# Patient Record
Sex: Male | Born: 1950 | Race: White | Hispanic: No | Marital: Married | Smoking: Former smoker
Health system: Southern US, Community
[De-identification: ages and names within clinical notes are randomized; demographics above are authoritative.]

## PROBLEM LIST (undated history)

## (undated) DIAGNOSIS — F0390 Unspecified dementia without behavioral disturbance: Secondary | ICD-10-CM

## (undated) DIAGNOSIS — E079 Disorder of thyroid, unspecified: Secondary | ICD-10-CM

## (undated) HISTORY — PX: HEMORRHOID SURGERY: SHX153

---

## 2018-02-27 ENCOUNTER — Ambulatory Visit
Admission: EM | Admit: 2018-02-27 | Discharge: 2018-02-27 | Disposition: A | Discharge status: Home / Self Care | Payer: Self-pay | Attending: Family Medicine | Admitting: Family Medicine

## 2018-02-27 ENCOUNTER — Other Ambulatory Visit: Payer: Self-pay

## 2018-02-27 ENCOUNTER — Encounter: Payer: Self-pay | Admitting: Emergency Medicine

## 2018-02-27 DIAGNOSIS — J069 Acute upper respiratory infection, unspecified: Secondary | ICD-10-CM

## 2018-02-27 DIAGNOSIS — B354 Tinea corporis: Secondary | ICD-10-CM

## 2018-02-27 DIAGNOSIS — R030 Elevated blood-pressure reading, without diagnosis of hypertension: Secondary | ICD-10-CM

## 2018-02-27 DIAGNOSIS — B9789 Other viral agents as the cause of diseases classified elsewhere: Secondary | ICD-10-CM | POA: Insufficient documentation

## 2018-02-27 HISTORY — DX: Unspecified dementia, unspecified severity, without behavioral disturbance, psychotic disturbance, mood disturbance, and anxiety: F03.90

## 2018-02-27 HISTORY — DX: Disorder of thyroid, unspecified: E07.9

## 2018-02-27 MED ORDER — DOXYCYCLINE HYCLATE 100 MG PO CAPS: 100.0000 mg | ORAL_CAPSULE | Freq: Two times a day (BID) | ORAL | 0 refills | Status: DC

## 2018-02-27 MED ORDER — CLOTRIMAZOLE-BETAMETHASONE 1-0.05 % EX CREA: TOPICAL_CREAM | CUTANEOUS | 0 refills | Status: DC

## 2018-02-27 NOTE — ED Triage Notes (Signed)
Patient in today c/o cough x 1 week. Patient denies fever. Patient has tried OTC Mucinex.

## 2018-02-27 NOTE — ED Provider Notes (Signed)
[facility] ____________________________________________  Time seen: Approximately 10:39 AM  I have reviewed the triage vital signs and the nursing notes.   HISTORY  Chief Complaint Cough  Historian: patient, wife and caregiver.  HPI Parker Chambers is a 67 y.o. male with history of dementia, presenting with wife and caregiver at bedside for evaluation of 1 week of nasal congestion, cough and postnasal drainage.  Caregiver states that his cough has started to sound deeper which is why they brought him in today.  Denies accompanying fevers.  Patient denies pain at this time.  No accompanying chest pain or shortness of breath.  Some intermittent sore throat as well as intermittent sinus pressure.  Unresolved with over-the-counter Mucinex.  Has continued to eat and drink well.  Denies known sick contacts.  Denies other aggravating or alleviating factors.  States cough is often dry but occasionally productive.  Caregiver states she believes she has occasionally heard some wheezing with cough.  Denies history of lung issues, pneumonia, COPD.  Denies renal insufficiency.   Also states that he has had a rash to left thigh for several weeks, states they were putting some over-the-counter cream on it and it got better but never resolved.  States also noticed 1 to right thigh.  Denies any changes in contacts.  Denies history of same.  Denies any pain.  States occasionally itchy not currently.  No insect bite.  Reports otherwise doing well denies other complaints.   Past Medical History:  Diagnosis Date  . Dementia (HCC)   . Thyroid disease     There are no active problems to display for this patient.   Past Surgical History:  Procedure Laterality Date  . HEMORRHOID SURGERY       No current facility-administered medications for this encounter.   Current Outpatient Medications:  .  donepezil (ARICEPT) 10 MG tablet, Take 10 mg by mouth at bedtime., Disp: , Rfl:  .   levothyroxine (SYNTHROID, LEVOTHROID) 100 MCG tablet, Take 100 mcg by mouth daily before breakfast., Disp: , Rfl:  .  memantine (NAMENDA) 10 MG tablet, Take 10 mg by mouth 2 (two) times daily., Disp: , Rfl:  .  mirtazapine (REMERON) 30 MG tablet, Take 30 mg by mouth at bedtime., Disp: , Rfl:  .  traZODone (DESYREL) 50 MG tablet, Take 50 mg by mouth 2 (two) times daily., Disp: , Rfl:  .  UNABLE TO FIND, Paracetamol 500 mg 2 po qid for pain (similar to Tylenol), Disp: , Rfl:  .  clotrimazole-betamethasone (LOTRISONE) cream, Apply to affected area 2 times daily for 2 weeks, Disp: 15 g, Rfl: 0 .  doxycycline (VIBRAMYCIN) 100 MG capsule, Take 1 capsule (100 mg total) by mouth 2 (two) times daily., Disp: 20 capsule, Rfl: 0  Allergies Patient has no known allergies.  Family History  Problem Relation Age of Onset  . Ovarian cancer Mother   . Pancreatic cancer Father     Social History Social History   Tobacco Use  . Smoking status: Former Smoker    Last attempt to quit: 02/27/1993    Years since quitting: 25.0  . Smokeless tobacco: Never Used  Substance Use Topics  . Alcohol use: Never    Frequency: Never  . Drug use: Never    Review of Systems Constitutional: No fever ENT:As above.  Cardiovascular: Denies chest pain. Respiratory: Denies shortness of breath. Gastrointestinal: No abdominal pain.  Musculoskeletal: Negative for back pain. Skin: Positive for rash.   ____________________________________________   PHYSICAL EXAM:  VITAL SIGNS: ED Triage Vitals  Enc Vitals Group     BP 02/27/18 1012 (!) 171/86     Pulse Rate 02/27/18 1012 100     Resp 02/27/18 1012 18     Temp 02/27/18 1012 98.2 F (36.8 C)     Temp Source 02/27/18 1012 Oral     SpO2 02/27/18 1012 96 %     Weight 02/27/18 1013 278 lb (126.1 kg)     Height 02/27/18 1013 6\' 1"  (1.854 m)     Head Circumference --      Peak Flow --      Pain Score 02/27/18 1011 6     Pain Loc --      Pain Edu? --      Excl.  in GC? --    Vitals:   02/27/18 1012 02/27/18 1013 02/27/18 1047  BP: (!) 171/86  (!) 150/90  Pulse: 100    Resp: 18    Temp: 98.2 F (36.8 C)    TempSrc: Oral    SpO2: 96%    Weight:  278 lb (126.1 kg)   Height:  6\' 1"  (1.854 m)      Constitutional: Alert.Well appearing and in no acute distress. Eyes: Conjunctivae are normal. Head: Atraumatic.No tenderness to palpation bilateral frontal and maxillary sinuses. No swelling. No erythema.   Ears: no erythema, normal TMs bilaterally.   Nose: nasal congestion with bilateral nasal turbinate erythema and edema.   Mouth/Throat: Mucous membranes are moist.  Oropharynx non-erythematous.No tonsillar swelling or exudate.  Neck: No stridor.  No cervical spine tenderness to palpation. Hematological/Lymphatic/Immunilogical: No cervical lymphadenopathy. Cardiovascular: Normal rate, regular rhythm. Grossly normal heart sounds.  Good peripheral circulation. Respiratory: Normal respiratory effort.  No retractions. No wheezes. Scattered rhonchi.  No focal area of consolidation.  Speaks in complete sentences.  Good air movement.  Gastrointestinal: Soft and nontender.  Musculoskeletal: Steady gait. Neurologic:  Normal speech and language. No gait instability. Skin:  Skin is warm, dry.  Except: Left thigh 2 areas, right thigh one area approximate 1 to 2 cm in size of oval erythematous margin with lighter center, scaly, nontender, no surrounding erythema, no drainage, no induration or fluctuance.   ___________________________________________   LABS (all labs ordered are listed, but only abnormal results are displayed)  Labs Reviewed - No data to display  PROCEDURES Procedures   INITIAL IMPRESSION / ASSESSMENT AND PLAN / ED COURSE  Pertinent labs & imaging results that were available during my care of the patient were reviewed by me and considered in my medical decision making (see chart for details).  Well-appearing patient.  No acute  distress.  Wife and caregiver bedside.  Suspect recent viral illness, concern for secondary infection.  Will start patient on oral doxycycline.  Discussed strict follow-up and return parameters including for no improvement.  Will defer chest x-ray at this time, patient and family agree.  Rash appearance consistent with tinea corporis, will treat with clotrimazole betamethasone.  Encourage supportive care.Discussed indication, risks and benefits of medications with patient and family.  Discussed follow up with Primary care physician this week. Discussed follow up and return parameters including no resolution or any worsening concerns. Patient and family verbalized understanding and agreed to plan.   ____________________________________________   FINAL CLINICAL IMPRESSION(S) / ED DIAGNOSES  Final diagnoses:  Viral URI with cough  Tinea corporis     ED Discharge Orders         Ordered    doxycycline (VIBRAMYCIN) 100 MG  capsule  2 times daily     02/27/18 1039    clotrimazole-betamethasone (LOTRISONE) cream     02/27/18 1039           Note: This dictation was prepared with Dragon dictation along with smaller phrase technology. Any transcriptional errors that result from this process are unintentional.        Renford Dills, NP 02/27/18 1108

## 2018-02-27 NOTE — Discharge Instructions (Addendum)
Take medication as prescribed. Rest. Drink plenty of fluids.  Continue to monitor blood pressure and keep a journal.  Over-the-counter plain Mucinex.  Follow up with your primary care physician this week as needed. Return to Urgent care for new or worsening concerns.

## 2019-08-04 ENCOUNTER — Inpatient Hospital Stay
Admission: EM | Admit: 2019-08-04 | Discharge: 2019-08-09 | DRG: 871 | Disposition: A | Discharge status: Skilled Nursing Facility | Payer: Medicare Other | Source: Skilled Nursing Facility | Attending: Internal Medicine | Admitting: Internal Medicine

## 2019-08-04 ENCOUNTER — Emergency Department: Payer: Medicare Other

## 2019-08-04 ENCOUNTER — Other Ambulatory Visit: Payer: Self-pay

## 2019-08-04 DIAGNOSIS — L03314 Cellulitis of groin: Secondary | ICD-10-CM | POA: Diagnosis present

## 2019-08-04 DIAGNOSIS — G92 Toxic encephalopathy: Secondary | ICD-10-CM | POA: Diagnosis present

## 2019-08-04 DIAGNOSIS — Z8673 Personal history of transient ischemic attack (TIA), and cerebral infarction without residual deficits: Secondary | ICD-10-CM

## 2019-08-04 DIAGNOSIS — I10 Essential (primary) hypertension: Secondary | ICD-10-CM | POA: Diagnosis present

## 2019-08-04 DIAGNOSIS — N179 Acute kidney failure, unspecified: Secondary | ICD-10-CM | POA: Diagnosis present

## 2019-08-04 DIAGNOSIS — Z6841 Body Mass Index (BMI) 40.0 and over, adult: Secondary | ICD-10-CM

## 2019-08-04 DIAGNOSIS — R4182 Altered mental status, unspecified: Secondary | ICD-10-CM | POA: Diagnosis not present

## 2019-08-04 DIAGNOSIS — F0280 Dementia in other diseases classified elsewhere without behavioral disturbance: Secondary | ICD-10-CM

## 2019-08-04 DIAGNOSIS — E039 Hypothyroidism, unspecified: Secondary | ICD-10-CM

## 2019-08-04 DIAGNOSIS — R1310 Dysphagia, unspecified: Secondary | ICD-10-CM | POA: Diagnosis present

## 2019-08-04 DIAGNOSIS — Z8041 Family history of malignant neoplasm of ovary: Secondary | ICD-10-CM

## 2019-08-04 DIAGNOSIS — M6282 Rhabdomyolysis: Secondary | ICD-10-CM

## 2019-08-04 DIAGNOSIS — B029 Zoster without complications: Secondary | ICD-10-CM | POA: Diagnosis present

## 2019-08-04 DIAGNOSIS — Z20822 Contact with and (suspected) exposure to covid-19: Secondary | ICD-10-CM | POA: Diagnosis present

## 2019-08-04 DIAGNOSIS — G309 Alzheimer's disease, unspecified: Secondary | ICD-10-CM

## 2019-08-04 DIAGNOSIS — Z87891 Personal history of nicotine dependence: Secondary | ICD-10-CM

## 2019-08-04 DIAGNOSIS — I248 Other forms of acute ischemic heart disease: Secondary | ICD-10-CM | POA: Diagnosis present

## 2019-08-04 DIAGNOSIS — Z800 Family history of malignant neoplasm of digestive organs: Secondary | ICD-10-CM

## 2019-08-04 DIAGNOSIS — J690 Pneumonitis due to inhalation of food and vomit: Secondary | ICD-10-CM | POA: Diagnosis not present

## 2019-08-04 DIAGNOSIS — A419 Sepsis, unspecified organism: Secondary | ICD-10-CM | POA: Diagnosis not present

## 2019-08-04 DIAGNOSIS — Z79899 Other long term (current) drug therapy: Secondary | ICD-10-CM

## 2019-08-04 DIAGNOSIS — R0902 Hypoxemia: Secondary | ICD-10-CM | POA: Diagnosis present

## 2019-08-04 DIAGNOSIS — E876 Hypokalemia: Secondary | ICD-10-CM | POA: Diagnosis present

## 2019-08-04 DIAGNOSIS — J189 Pneumonia, unspecified organism: Secondary | ICD-10-CM

## 2019-08-04 DIAGNOSIS — F0390 Unspecified dementia without behavioral disturbance: Secondary | ICD-10-CM

## 2019-08-04 DIAGNOSIS — Z79890 Hormone replacement therapy (postmenopausal): Secondary | ICD-10-CM

## 2019-08-04 LAB — CBC WITH DIFFERENTIAL/PLATELET
Abs Immature Granulocytes: 0.12 10*3/uL — ABNORMAL HIGH (ref 0.00–0.07)
Basophils Absolute: 0.1 10*3/uL (ref 0.0–0.1)
Basophils Relative: 0 %
Eosinophils Absolute: 0 10*3/uL (ref 0.0–0.5)
Eosinophils Relative: 0 %
HCT: 53 % — ABNORMAL HIGH (ref 39.0–52.0)
Hemoglobin: 17.5 g/dL — ABNORMAL HIGH (ref 13.0–17.0)
Immature Granulocytes: 1 %
Lymphocytes Relative: 1 %
Lymphs Abs: 0.2 10*3/uL — ABNORMAL LOW (ref 0.7–4.0)
MCH: 29.6 pg (ref 26.0–34.0)
MCHC: 33 g/dL (ref 30.0–36.0)
MCV: 89.5 fL (ref 80.0–100.0)
Monocytes Absolute: 1.3 10*3/uL — ABNORMAL HIGH (ref 0.1–1.0)
Monocytes Relative: 5 %
Neutro Abs: 22.8 10*3/uL — ABNORMAL HIGH (ref 1.7–7.7)
Neutrophils Relative %: 93 %
Platelets: 292 10*3/uL (ref 150–400)
RBC: 5.92 MIL/uL — ABNORMAL HIGH (ref 4.22–5.81)
RDW: 12.9 % (ref 11.5–15.5)
WBC: 24.5 10*3/uL — ABNORMAL HIGH (ref 4.0–10.5)
nRBC: 0 % (ref 0.0–0.2)

## 2019-08-04 LAB — COMPREHENSIVE METABOLIC PANEL
ALT: 23 U/L (ref 0–44)
AST: 58 U/L — ABNORMAL HIGH (ref 15–41)
Albumin: 4.1 g/dL (ref 3.5–5.0)
Alkaline Phosphatase: 70 U/L (ref 38–126)
Anion gap: 14 (ref 5–15)
BUN: 21 mg/dL (ref 8–23)
CO2: 23 mmol/L (ref 22–32)
Calcium: 9.1 mg/dL (ref 8.9–10.3)
Chloride: 103 mmol/L (ref 98–111)
Creatinine, Ser: 1.26 mg/dL — ABNORMAL HIGH (ref 0.61–1.24)
GFR calc Af Amer: >60 mL/min (ref >60)
GFR calc non Af Amer: 58 mL/min — ABNORMAL LOW (ref >60)
Glucose, Bld: 176 mg/dL — ABNORMAL HIGH (ref 70–99)
Potassium: 3.7 mmol/L (ref 3.5–5.1)
Sodium: 140 mmol/L (ref 135–145)
Total Bilirubin: 1.3 mg/dL — ABNORMAL HIGH (ref 0.3–1.2)
Total Protein: 7.3 g/dL (ref 6.5–8.1)

## 2019-08-04 LAB — SARS CORONAVIRUS 2 BY RT PCR (HOSPITAL ORDER, PERFORMED IN ~~LOC~~ HOSPITAL LAB): SARS Coronavirus 2: NEGATIVE

## 2019-08-04 LAB — URINALYSIS, COMPLETE (UACMP) WITH MICROSCOPIC
Bacteria, UA: NONE SEEN
Specific Gravity, Urine: 1.033 — ABNORMAL HIGH (ref 1.005–1.030)
Squamous Epithelial / HPF: NONE SEEN (ref 0–5)

## 2019-08-04 LAB — CK: Total CK: 2609 U/L — ABNORMAL HIGH (ref 49–397)

## 2019-08-04 LAB — LACTIC ACID, PLASMA
Lactic Acid, Venous: 3.3 mmol/L (ref 0.5–1.9)
Lactic Acid, Venous: 3.2 mmol/L (ref 0.5–1.9)

## 2019-08-04 LAB — TROPONIN I (HIGH SENSITIVITY)
Troponin I (High Sensitivity): 27 ng/L — ABNORMAL HIGH (ref <18)
Troponin I (High Sensitivity): 34 ng/L — ABNORMAL HIGH (ref <18)

## 2019-08-04 MED ORDER — VANCOMYCIN HCL IN DEXTROSE 1-5 GM/200ML-% IV SOLN
1000.0000 mg | Freq: Once | INTRAVENOUS | Status: AC
  Administered 2019-08-04: 1000 mg via INTRAVENOUS
  Filled 2019-08-04: qty 200

## 2019-08-04 MED ORDER — PIPERACILLIN-TAZOBACTAM 3.375 G IVPB 30 MIN
3.3750 g | Freq: Once | INTRAVENOUS | Status: AC
  Administered 2019-08-04: 3.375 g via INTRAVENOUS
  Filled 2019-08-04: qty 50

## 2019-08-04 MED ORDER — SODIUM CHLORIDE 0.9 % IV BOLUS
1000.0000 mL | Freq: Once | INTRAVENOUS | Status: AC
  Administered 2019-08-04: 1000 mL via INTRAVENOUS

## 2019-08-04 MED ORDER — VALACYCLOVIR HCL 500 MG PO TABS
1000.0000 mg | ORAL_TABLET | Freq: Every day | ORAL | Status: DC
  Administered 2019-08-05 – 2019-08-08 (×4): 1000 mg via ORAL
  Filled 2019-08-04 (×5): qty 2

## 2019-08-04 MED ORDER — ACETAMINOPHEN 650 MG RE SUPP
650.0000 mg | Freq: Once | RECTAL | Status: AC
  Administered 2019-08-04: 650 mg via RECTAL
  Filled 2019-08-04: qty 1

## 2019-08-04 NOTE — ED Triage Notes (Signed)
Pt from [facility] with ams. Pt with history of dementia. Per ems last known normal "[sometime this morning". Pt states "ow" when left arm touched, md at bedside.

## 2019-08-04 NOTE — ED Provider Notes (Signed)
[facility] EMERGENCY DEPARTMENT Provider Note   CSN: [id number] Arrival date & time: 08/04/19  1932     History Chief Complaint  Patient presents with  . Altered Mental Status    Parker Chambers is a 69 y.o. male history of dementia, here presenting with altered mental status.  Patient is from [facility] .  Patient usually walks by himself and wife noticed that he has been confused since yesterday .  He is eating less and not walking as much.  He has underlying dementia but he is more altered than usual.  He seems to have some shakes but denies any fevers.  Per EMS, patient has normal blood glucose.  Patient was noted to be covered in feces today.  The history is provided by the patient.  Level V caveat- dementia      Past Medical History:  Diagnosis Date  . Dementia ([place])   . Thyroid disease     There are no problems to display for this patient.   Past Surgical History:  Procedure Laterality Date  . HEMORRHOID SURGERY         Family History  Problem Relation Age of Onset  . Ovarian cancer Mother   . Pancreatic cancer Father     Social History   Tobacco Use  . Smoking status: Former Smoker    Quit date: 02/27/1993    Years since quitting: 26.4  . Smokeless tobacco: Never Used  Substance Use Topics  . Alcohol use: Never  . Drug use: Never    Home Medications Prior to Admission medications   Medication Sig Start Date End Date Taking? Authorizing Provider  clotrimazole-betamethasone (LOTRISONE) cream Apply to affected area 2 times daily for 2 weeks 02/27/18   Marylene Land, NP  donepezil (ARICEPT) 10 MG tablet Take 10 mg by mouth at bedtime.    [provider]  doxycycline (VIBRAMYCIN) 100 MG capsule Take 1 capsule (100 mg total) by mouth 2 (two) times daily. 02/27/18   Marylene Land, NP  levothyroxine (SYNTHROID, LEVOTHROID) 100 MCG tablet Take 100 mcg by mouth daily before breakfast.    [provider]  memantine (NAMENDA) 10 MG tablet Take 10 mg by mouth 2 (two) times daily.    [provider]  mirtazapine (REMERON) 30 MG tablet Take 30 mg by mouth at bedtime.    [provider]  traZODone (DESYREL) 50 MG tablet Take 50 mg by mouth 2 (two) times daily.    [provider]  UNABLE TO FIND Paracetamol 500 mg 2 po qid for pain (similar to Tylenol)    [provider]    Allergies    Patient has no known allergies.  Review of Systems   Review of Systems  Neurological: Positive for weakness.  Psychiatric/Behavioral: Positive for confusion.  All other systems reviewed and are negative.   Physical Exam Updated Vital Signs BP 138/84 (BP Location: Right Arm)   Pulse (!) 130   Temp (!) 101.3 F (38.5 C) (Rectal)   Resp (!) 30   Ht 6' (1.829 m)   Wt (!) 140.6 kg   SpO2 92%   BMI 42.04 kg/m   Physical Exam Vitals and nursing note reviewed.  Constitutional:      Comments: Chronically ill, confused   HENT:     Head: Normocephalic.     Mouth/Throat:     Mouth: Mucous membranes are dry.     Comments: ? R facial droop but per the  wife, this is unchanged. Eyes:     Extraocular Movements: Extraocular movements intact.     Pupils: Pupils are equal, round, and reactive to light.  Cardiovascular:     Rate and Rhythm: Regular rhythm. Tachycardia present.     Pulses: Normal pulses.     Heart sounds: Normal heart sounds.  Pulmonary:     Comments: Slightly tachypneic, crackles bilateral bases. Abdominal:     General: Abdomen is flat.     Palpations: Abdomen is soft.  Musculoskeletal:        General: Normal range of motion.     Cervical back: Normal range of motion.  Skin:    General: Skin is warm.     Capillary Refill: Capillary refill takes less than 2 seconds.  Neurological:     Comments: Confused, difficulty following commands.  Patient is moving all extremities.   Psychiatric:     Comments: Unable      ED Results /  Procedures / Treatments   Labs (all labs ordered are listed, but only abnormal results are displayed) Labs Reviewed  CULTURE, BLOOD (ROUTINE X 2)  CULTURE, BLOOD (ROUTINE X 2)  SARS CORONAVIRUS 2 BY RT PCR (HOSPITAL ORDER, PERFORMED IN [facility])  URINE CULTURE  CBC WITH DIFFERENTIAL/PLATELET  COMPREHENSIVE METABOLIC PANEL  CK  LACTIC ACID, PLASMA  URINALYSIS, COMPLETE (UACMP) WITH MICROSCOPIC  TROPONIN I (HIGH SENSITIVITY)    EKG EKG Interpretation  Date/Time:  Sunday Aug 04 2019 19:51:44 EDT Ventricular Rate:  123 PR Interval:    QRS Duration: 84 QT Interval:  310 QTC Calculation: 444 R Axis:   -10 Text Interpretation: Sinus tachycardia Ventricular premature complex Aberrant conduction of SV complex(es) Probable left atrial enlargement Borderline low voltage, extremity leads Abnormal R-wave progression, early transition No significant change since last tracing Confirmed by Richardean Canal [phone]) on 08/04/2019 7:55:33 PM   Radiology No results found.  Procedures Procedures (including critical care time)  CRITICAL CARE Performed by: Richardean Canal   Total critical care time: 30  minutes  Critical care time was exclusive of separately billable procedures and treating other patients.  Critical care was necessary to treat or prevent imminent or life-threatening deterioration.  Critical care was time spent personally by me on the following activities: development of treatment plan with patient and/or surrogate as well as nursing, discussions with consultants, evaluation of patient's response to treatment, examination of patient, obtaining history from patient or surrogate, ordering and performing treatments and interventions, ordering and review of laboratory studies, ordering and review of radiographic studies, pulse oximetry and re-evaluation of patient's condition.   Medications Ordered in ED Medications  vancomycin (VANCOCIN) IVPB 1000 mg/200 mL premix (has  no administration in time range)  piperacillin-tazobactam (ZOSYN) IVPB 3.375 g (3.375 g Intravenous New Bag/Given 08/04/19 2022)  sodium chloride 0.9 % bolus 1,000 mL (1,000 mLs Intravenous New Bag/Given 08/04/19 1948)  acetaminophen (TYLENOL) suppository 650 mg (650 mg Rectal Given 08/04/19 2020)    ED Course  I have reviewed the triage vital signs and the nursing notes.  Pertinent labs & imaging results that were available during my care of the patient were reviewed by me and considered in my medical decision making (see chart for details).    MDM Rules/Calculators/A&P                      Monterio Bob is a 69 y.o. male who presented with altered mental status.  Initially I thought he may  have a facial droop but per the wife, this is baseline.  Patient has a history of dementia so it is very difficult to examine or follow commands.  In the ED, he is febrile and tachycardic.  I am concerned that he may be septic from either pneumonia or urinary tract infection or bacteremia.  Will get CBC, CMP, lactate, cultures, chest x-ray, urinalysis.  Will give IV fluids as broad-spectrum antibiotics.  We will also get CT head to rule out bleed.  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  Chest x-ray showed atelectasis but patient clinically has pneumonia as he has crackles and he is borderline hypoxic.  Patient also had white blood cell count of 24,000.  Patient's urinalysis did not show any bacteria.  Pelvis x-ray showed possible rib fracture and CT did not show any hip or pelvic fracture.  Patient also is in rhabdo and his CK is 2600 from laying in bed.  His lactate is elevated at 3.3.  Patient received broad-spectrum antibiotics and IV fluids.  Will admit for pneumonia, possible bacteremia, rhabdomyolysis.  Final Clinical Impression(s) / ED Diagnoses Final diagnoses:  None    Rx / DC Orders ED Discharge Orders    None       Charlynne Pander, MD 08/04/19 2221

## 2019-08-04 NOTE — ED Notes (Signed)
Date and time results received: 08/04/19 2038 (use smartphrase ".now" to insert current time)  Test: Lactic Acid Critical Value: 3.3  Name of Provider Notified: Dr. Silverio Lay  Orders Received? Or Actions Taken?: Actions Taken: provider notified

## 2019-08-04 NOTE — ED Notes (Signed)
Provider notified that pt was placed on 2 LPM via [place] of oxygen

## 2019-08-04 NOTE — ED Notes (Signed)
Pt presents to the ER. Pt was covered in feces from the lower back down. PT has skin break down to the right arm, groin and buttock. Pt has lower leg swelling. Pt was cleaned on arrival and clean depends placed. Pt is altered and unable to answer any questions appropriate, but will endorse pain on movement. NIH unable to be obtained as pt does not answer any questions appropriately and does not follow any instructions.

## 2019-08-04 NOTE — ED Notes (Signed)
Pt in ct 

## 2019-08-04 NOTE — ED Notes (Signed)
Pt moved to hospital bed for comfort. Condom cath, large applied to catch urine for I and o.

## 2019-08-04 NOTE — H&P (Addendum)
PCP:   Wilford Corner, PA-C   Chief Complaint:    HPI: This is a 69 year old male with dementia resident of an independent living center.  He was sent in because he is getting progressively weaker and confused and today was noted to be covered in feces.   In the ER the patient is hypoxic satting in the high 80s.  He appears to have rhabdo.  And also appears to be septic.  He has lactic acidosis, is tachycardic and tachypneic.  He is unable to provide any history due to his dementia    Review of Systems: unable to obtain d/t dementia  Past Medical History: Past Medical History:  Diagnosis Date  . Dementia (HCC)   . Thyroid disease    Past Surgical History:  Procedure Laterality Date  . HEMORRHOID SURGERY      Medications: Prior to Admission medications   Medication Sig Start Date End Date Taking? Authorizing Provider  clotrimazole-betamethasone (LOTRISONE) cream Apply to affected area 2 times daily for 2 weeks 02/27/18   Renford Dills, NP  donepezil (ARICEPT) 10 MG tablet Take 10 mg by mouth at bedtime.    [provider]  doxycycline (VIBRAMYCIN) 100 MG capsule Take 1 capsule (100 mg total) by mouth 2 (two) times daily. 02/27/18   Renford Dills, NP  levothyroxine (SYNTHROID, LEVOTHROID) 100 MCG tablet Take 100 mcg by mouth daily before breakfast.    [provider]  memantine (NAMENDA) 10 MG tablet Take 10 mg by mouth 2 (two) times daily.    [provider]  mirtazapine (REMERON) 30 MG tablet Take 30 mg by mouth at bedtime.    [provider]  traZODone (DESYREL) 50 MG tablet Take 50 mg by mouth 2 (two) times daily.    [provider]  UNABLE TO FIND Paracetamol 500 mg 2 po qid for pain (similar to Tylenol)    [provider]    Allergies:  No Known Allergies  Social History:  reports that he quit smoking about 26 years ago. He has never used smokeless tobacco. He reports that he does not drink alcohol or use  drugs.  Family History: Family History  Problem Relation Age of Onset  . Ovarian cancer Mother   . Pancreatic cancer Father     Physical Exam: Vitals:   08/04/19 2044 08/04/19 2130 08/04/19 2145 08/04/19 2200  BP:  (!) 154/84  (!) 151/82  Pulse: (!) 110 (!) 109 99 100  Resp:   (!) 24 20  Temp: (!) 100.9 F (38.3 C)     TempSrc:      SpO2: 95% 93% 96% 95%  Weight:      Height:        General: demented male, well developed and nourished, no acute distress Eyes: PERRLA, pink conjunctiva, no scleral icterus ENT: Moist oral mucosa, neck supple, no thyromegaly Lungs: clear to ascultation, no wheeze, no crackles, no use of accessory muscles Cardiovascular: regular rate and rhythm, no regurgitation, no gallops, no murmurs. No carotid bruits, no JVD Abdomen: soft, positive BS, non-tender, non-distended, no organomegaly, not an acute abdomen GU: not examined Neuro: CN II - XII grossly intact, sensation intact Musculoskeletal: strength 5/5 all extremities, no clubbing, cyanosis or edema Skin: cellulitis  In pubic region, redness on face and blister left chest wall Psych: demented male   Labs on Admission:  Recent Labs    08/04/19 1949  NA 140  K 3.7  CL 103  CO2 23  GLUCOSE 176*  BUN 21  CREATININE 1.26*  CALCIUM 9.1   Recent Labs    08/04/19 1949  AST 58*  ALT 23  ALKPHOS 70  BILITOT 1.3*  PROT 7.3  ALBUMIN 4.1   No results for input(s): LIPASE, AMYLASE in the last 72 hours. Recent Labs    08/04/19 1949  WBC 24.5*  NEUTROABS 22.8*  HGB 17.5*  HCT 53.0*  MCV 89.5  PLT 292   Recent Labs    08/04/19 1949  CKTOTAL 2,609*   Invalid input(s): POCBNP No results for input(s): DDIMER in the last 72 hours. No results for input(s): HGBA1C in the last 72 hours. No results for input(s): CHOL, HDL, LDLCALC, TRIG, CHOLHDL, LDLDIRECT in the last 72 hours. No results for input(s): TSH, T4TOTAL, T3FREE, THYROIDAB in the last 72 hours.  Invalid input(s): FREET3 No  results for input(s): VITAMINB12, FOLATE, FERRITIN, TIBC, IRON, RETICCTPCT in the last 72 hours.  Micro Results: Recent Results (from the past 240 hour(s))  SARS Coronavirus 2 by RT PCR (hospital order, performed in [facility] hospital lab) Nasopharyngeal Nasopharyngeal Swab     Status: None   Collection Time: 08/04/19  7:49 PM   Specimen: Nasopharyngeal Swab  Result Value Ref Range Status   SARS Coronavirus 2 NEGATIVE NEGATIVE Final    Comment: (NOTE) SARS-CoV-2 target nucleic acids are NOT DETECTED. The SARS-CoV-2 RNA is generally detectable in upper and lower respiratory specimens during the acute phase of infection. The lowest concentration of SARS-CoV-2 viral copies this assay can detect is 250 copies / mL. A negative result does not preclude SARS-CoV-2 infection and should not be used as the sole basis for treatment or other patient management decisions.  A negative result may occur with improper specimen collection / handling, submission of specimen other than nasopharyngeal swab, presence of viral mutation(s) within the areas targeted by this assay, and inadequate number of viral copies (<250 copies / mL). A negative result must be combined with clinical observations, patient history, and epidemiological information. Fact Sheet for Patients:   BoilerBrush.com.cy Fact Sheet for Healthcare Providers: https://pope.com/ This test is not yet approved or cleared  by the [place] FDA and has been authorized for detection and/or diagnosis of SARS-CoV-2 by FDA under an Emergency Use Authorization (EUA).  This EUA will remain in effect (meaning this test can be used) for the duration of the COVID-19 declaration under Section 564(b)(1) of the Act, 21 U.S.C. section 360bbb-3(b)(1), unless the authorization is terminated or revoked sooner. Performed at [facility], [address] [place], [place] [address]       Radiological Exams on Admission: CT Head Wo Contrast  Result Date: 08/04/2019 CLINICAL DATA:  Transient ischemic attack. History of dementia. EXAM: CT HEAD WITHOUT CONTRAST TECHNIQUE: Contiguous axial images were obtained from the base of the skull through the vertex without intravenous contrast. COMPARISON:  None. FINDINGS: Brain: No evidence of acute infarction, hemorrhage, hydrocephalus, extra-axial collection or mass lesion/mass effect. Atrophy and extensive chronic microvascular ischemic change. 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 Vascular: No hyperdense vessel or unexpected calcification. Skull: Normal. Negative for fracture or focal lesion. Sinuses/Orbits: No acute finding. Chronic appearing mucosal thickening and small amount of fluid in the dependent RIGHT maxillary sinus. Other: None. IMPRESSION: 1. No acute intracranial abnormality. 2. Atrophy and extensive chronic microvascular ischemic change. 3. 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 4. Mild RIGHT maxillary sinus disease. Electronically Signed   By: Donzetta Kohut M.D.   On:  08/04/2019 21:13   CT PELVIS WO CONTRAST  Result Date: 08/04/2019 CLINICAL DATA:  Pelvic trauma EXAM: CT PELVIS WITHOUT CONTRAST TECHNIQUE: Multidetector CT imaging of the pelvis was performed following the standard protocol without intravenous contrast. COMPARISON:  Radiograph 08/04/2019 FINDINGS: Urinary Tract: Distal ureters are unremarkable. Urinary bladder is largely decompressed at the time of exam and therefore poorly evaluated by CT imaging. No gross bladder abnormality. Bowel: Unremarkable visualized pelvic bowel loops. No evidence of obstruction. Vascular/Lymphatic: Atherosclerotic calcification of the iliac arteries and bilateral common femoral, left profundus femoral and right superficial femoral arteries. No aneurysm or ectasia. No worrisome lymph nodes. Reproductive: Mild prostatomegaly with coarse, eccentric calcifications of  the prostate. Seminal vesicles are unremarkable. Nonspecific calcifications along the corpora of the penile base, likely vascular. Other: Tiny fat containing umbilical hernia. Small bilateral fat containing inguinal hernias as well. No traumatic body wall dehiscence, contusive change, or hematoma. Musculoskeletal: No visible right femoral fracture. Lucency on the comparison radiograph may have been a projectional anomaly. Both femora are intact and normally seated within the acetabuli. There are enthesopathic changes about the greater and lesser trochanters including a corticated and the so fight versus joint body along the posterior femoral neck on the right. The bilateral hips demonstrate moderate periarticular degenerative change with some femoral over coverage which may predispose the patient to femoral impingement syndromes. The bony pelvis is intact and congruent. Near complete bony fusion of the SI joints with some underlying mild sclerotic changes. Enthesopathic changes upon the iliac crest and ischial tuberosities as well. Discogenic changes also noted in the included lumbar levels with posterior disc bulging and facet arthropathy. IMPRESSION: 1. No visible right femoral or pelvic fracture. Lucency on the comparison radiograph may have been a projectional anomaly. 2. Moderate bilateral femoroacetabular degenerative chain with acetabular overcoverage of the femoral heads, which may predispose the patient to femoral impingement syndromes. Correlate with clinical symptoms. 3. Ankylosis of the SI joints. 4. Degenerative changes in the lumbar spine. 5. Prostatomegaly. 6. Aortic Atherosclerosis (ICD10-I70.0). Electronically Signed   By: Kreg Shropshire M.D.   On: 08/04/2019 21:25   DG Pelvis Portable  Result Date: 08/04/2019 CLINICAL DATA:  Fall with altered mental status EXAM: PORTABLE PELVIS 1-2 VIEWS COMPARISON:  None. FINDINGS: SI joints are non widened. Pubic symphysis and rami are intact. Both femoral  heads project in joint. Questionable linear lucency within the greater trochanter of the right femur. IMPRESSION: Questionable lucency within the greater trochanter of the right femur. Recommend dedicated views of the right hip. Electronically Signed   By: Jasmine Pang M.D.   On: 08/04/2019 20:53   DG Chest Port 1 View  Result Date: 08/04/2019 CLINICAL DATA:  Fall, altered mental status EXAM: PORTABLE CHEST 1 VIEW COMPARISON:  None. FINDINGS: Low lung volumes with streaky basilar opacities likely reflecting atelectasis. A more bandlike opacity in the periphery of the left lung base could reflect further subsegmental atelectatic change. Central vascular crowding. No consolidation, features of edema, pneumothorax, or effusion. The aorta is calcified. The remaining cardiomediastinal contours are unremarkable. No acute osseous or soft tissue abnormality. Degenerative changes are present in the imaged spine and shoulders. Telemetry leads overlie the chest. IMPRESSION: Low lung volumes with basilar atelectasis. No other acute cardiopulmonary abnormality. Aortic Atherosclerosis (ICD10-I70.0). Electronically Signed   By: Kreg Shropshire M.D.   On: 08/04/2019 20:52    CLINICAL DATA:  Hypoxia.  EXAM: CT CHEST WITHOUT CONTRAST  TECHNIQUE: Multidetector CT imaging of the chest was performed following the standard  protocol without IV contrast.  COMPARISON:  Radiograph earlier this day. No prior exams available.  FINDINGS: Cardiovascular: Atherosclerosis of the thoracic aorta. No aneurysm. Upper normal heart size with coronary artery calcifications. Trace pericardial fluid may be physiologic versus small effusion.  Mediastinum/Nodes: Motion artifact through the hila and lack of contrast limits assessment for hilar adenopathy. There are small mediastinal lymph nodes that are not enlarged by size criteria. No thyroid nodule. Decompressed esophagus. Small hiatal hernia.  Lungs/Pleura: Breathing motion  artifact limits parenchymal assessment. 9 mm subpleural nodular opacity in the right lung apex, series 3, image 33. Dependent linear opacities in the right lower lobe, favor atelectasis. There are patchy and linear opacities in the left lower lobe and lingula. Suggestion of mucus/retained debris in the right mainstem bronchus, motion limits more detailed assessment. No pleural fluid. No pulmonary edema.  Upper Abdomen: No acute findings allowing for motion. Ingested material within the stomach.  Musculoskeletal: There are no acute or suspicious osseous abnormalities. Degenerative change in the spine.  IMPRESSION: 1. Breathing motion artifact limits assessment. 2. Dependent opacities in both lower lobes and lingula, atelectasis versus less likely pneumonia or aspiration. There is retained mucus/debris in the right mainstem bronchus. 3. Subpleural nodular opacity in the right lung apex measures 9 mm. Recommend correlation with any prior exams of performed elsewhere. In the absence of prior exams, follow-up CT in 3 months recommended for reassessment. 4. Coronary artery calcifications.  Aortic Atherosclerosis (ICD10-I70.0).   Electronically Signed   By: Keith Rake M.D.  Assessment/Plan Present on Admission: . Sepsis (HCC)/lactic acidosis/pneumonia, concern for aspiration -Admit to med telemetry -Blood cultures x2 -N.p.o., gentle IV fluid hydration -IV Unasyn -Swallow evaluation -Oxygen, nebulizers -Repeat lactic acid level  cellulitis groin/?shingles left chest wall -No report of diarrhea -We will add IV Vanco and p.o. valacyclovir -Condom catheter ordered  Rhabdomyolysis -Gentle IV fluid hydration -Follow CKs  Elevated troponin -Will follow  Hypothyroidism -check TSH  Dementia -stable, home meds resumed  Danyle Boening 08/04/2019, 10:15 PM

## 2019-08-04 NOTE — ED Notes (Signed)
Pts wife at bedside. Due to the low oxygen, pt placed on 2LPM via [place] of oxygen. Wife states sometime yesterday, pt started to act different. As per wife, symptoms started yesterday, including not being as active and not getting out of bed.

## 2019-08-05 ENCOUNTER — Observation Stay
Admit: 2019-08-05 | Discharge: 2019-08-05 | Disposition: A | Discharge status: Home / Self Care | Payer: Medicare Other | Attending: Family Medicine | Admitting: Family Medicine

## 2019-08-05 DIAGNOSIS — N179 Acute kidney failure, unspecified: Secondary | ICD-10-CM | POA: Diagnosis present

## 2019-08-05 DIAGNOSIS — L03314 Cellulitis of groin: Secondary | ICD-10-CM | POA: Diagnosis present

## 2019-08-05 DIAGNOSIS — R1310 Dysphagia, unspecified: Secondary | ICD-10-CM | POA: Diagnosis present

## 2019-08-05 DIAGNOSIS — Z800 Family history of malignant neoplasm of digestive organs: Secondary | ICD-10-CM | POA: Diagnosis not present

## 2019-08-05 DIAGNOSIS — I10 Essential (primary) hypertension: Secondary | ICD-10-CM | POA: Diagnosis present

## 2019-08-05 DIAGNOSIS — Z20822 Contact with and (suspected) exposure to covid-19: Secondary | ICD-10-CM | POA: Diagnosis present

## 2019-08-05 DIAGNOSIS — Z8673 Personal history of transient ischemic attack (TIA), and cerebral infarction without residual deficits: Secondary | ICD-10-CM | POA: Diagnosis not present

## 2019-08-05 DIAGNOSIS — Z87891 Personal history of nicotine dependence: Secondary | ICD-10-CM | POA: Diagnosis not present

## 2019-08-05 DIAGNOSIS — Z79899 Other long term (current) drug therapy: Secondary | ICD-10-CM | POA: Diagnosis not present

## 2019-08-05 DIAGNOSIS — A419 Sepsis, unspecified organism: Secondary | ICD-10-CM | POA: Diagnosis present

## 2019-08-05 DIAGNOSIS — Z6841 Body Mass Index (BMI) 40.0 and over, adult: Secondary | ICD-10-CM | POA: Diagnosis not present

## 2019-08-05 DIAGNOSIS — R0902 Hypoxemia: Secondary | ICD-10-CM | POA: Diagnosis present

## 2019-08-05 DIAGNOSIS — E039 Hypothyroidism, unspecified: Secondary | ICD-10-CM | POA: Diagnosis present

## 2019-08-05 DIAGNOSIS — F0390 Unspecified dementia without behavioral disturbance: Secondary | ICD-10-CM | POA: Diagnosis present

## 2019-08-05 DIAGNOSIS — B029 Zoster without complications: Secondary | ICD-10-CM | POA: Diagnosis present

## 2019-08-05 DIAGNOSIS — J690 Pneumonitis due to inhalation of food and vomit: Secondary | ICD-10-CM | POA: Diagnosis present

## 2019-08-05 DIAGNOSIS — Z8041 Family history of malignant neoplasm of ovary: Secondary | ICD-10-CM | POA: Diagnosis not present

## 2019-08-05 DIAGNOSIS — G92 Toxic encephalopathy: Secondary | ICD-10-CM | POA: Diagnosis present

## 2019-08-05 DIAGNOSIS — E876 Hypokalemia: Secondary | ICD-10-CM | POA: Diagnosis present

## 2019-08-05 DIAGNOSIS — M6282 Rhabdomyolysis: Secondary | ICD-10-CM | POA: Diagnosis present

## 2019-08-05 DIAGNOSIS — I248 Other forms of acute ischemic heart disease: Secondary | ICD-10-CM | POA: Diagnosis present

## 2019-08-05 DIAGNOSIS — Z79890 Hormone replacement therapy (postmenopausal): Secondary | ICD-10-CM | POA: Diagnosis not present

## 2019-08-05 LAB — ECHOCARDIOGRAM COMPLETE
Height: 72 in
Weight: 4960 oz

## 2019-08-05 LAB — CBC
HCT: 47.2 % (ref 39.0–52.0)
HCT: 47.2 % (ref 39.0–52.0)
Hemoglobin: 15.1 g/dL (ref 13.0–17.0)
Hemoglobin: 16 g/dL (ref 13.0–17.0)
MCH: 29.5 pg (ref 26.0–34.0)
MCH: 29.9 pg (ref 26.0–34.0)
MCHC: 32 g/dL (ref 30.0–36.0)
MCHC: 33.9 g/dL (ref 30.0–36.0)
MCV: 88.2 fL (ref 80.0–100.0)
MCV: 92.2 fL (ref 80.0–100.0)
Platelets: 210 10*3/uL (ref 150–400)
Platelets: 232 10*3/uL (ref 150–400)
RBC: 5.12 MIL/uL (ref 4.22–5.81)
RBC: 5.35 MIL/uL (ref 4.22–5.81)
RDW: 13.1 % (ref 11.5–15.5)
RDW: 13.1 % (ref 11.5–15.5)
WBC: 16.5 10*3/uL — ABNORMAL HIGH (ref 4.0–10.5)
WBC: 19.1 10*3/uL — ABNORMAL HIGH (ref 4.0–10.5)
nRBC: 0 % (ref 0.0–0.2)
nRBC: 0 % (ref 0.0–0.2)

## 2019-08-05 LAB — COMPREHENSIVE METABOLIC PANEL
ALT: 21 U/L (ref 0–44)
AST: 48 U/L — ABNORMAL HIGH (ref 15–41)
Albumin: 3.4 g/dL — ABNORMAL LOW (ref 3.5–5.0)
Alkaline Phosphatase: 54 U/L (ref 38–126)
Anion gap: 8 (ref 5–15)
BUN: 18 mg/dL (ref 8–23)
CO2: 26 mmol/L (ref 22–32)
Calcium: 8.2 mg/dL — ABNORMAL LOW (ref 8.9–10.3)
Chloride: 108 mmol/L (ref 98–111)
Creatinine, Ser: 1 mg/dL (ref 0.61–1.24)
GFR calc Af Amer: >60 mL/min (ref >60)
GFR calc non Af Amer: >60 mL/min (ref >60)
Glucose, Bld: 114 mg/dL — ABNORMAL HIGH (ref 70–99)
Potassium: 4.1 mmol/L (ref 3.5–5.1)
Sodium: 142 mmol/L (ref 135–145)
Total Bilirubin: 1 mg/dL (ref 0.3–1.2)
Total Protein: 6.4 g/dL — ABNORMAL LOW (ref 6.5–8.1)

## 2019-08-05 LAB — URINE CULTURE: Culture: NO GROWTH

## 2019-08-05 LAB — CREATININE, SERUM
Creatinine, Ser: 1.07 mg/dL (ref 0.61–1.24)
GFR calc Af Amer: >60 mL/min (ref >60)
GFR calc non Af Amer: >60 mL/min (ref >60)

## 2019-08-05 LAB — TROPONIN I (HIGH SENSITIVITY)
Troponin I (High Sensitivity): 32 ng/L — ABNORMAL HIGH (ref <18)
Troponin I (High Sensitivity): 30 ng/L — ABNORMAL HIGH (ref <18)

## 2019-08-05 LAB — LACTIC ACID, PLASMA
Lactic Acid, Venous: 1.4 mmol/L (ref 0.5–1.9)
Lactic Acid, Venous: 1.3 mmol/L (ref 0.5–1.9)

## 2019-08-05 LAB — CK: Total CK: 1603 U/L — ABNORMAL HIGH (ref 49–397)

## 2019-08-05 MED ORDER — ENOXAPARIN SODIUM 40 MG/0.4ML ~~LOC~~ SOLN
40.0000 mg | Freq: Two times a day (BID) | SUBCUTANEOUS | Status: DC
  Administered 2019-08-05 – 2019-08-09 (×9): 40 mg via SUBCUTANEOUS
  Filled 2019-08-05 (×9): qty 0.4

## 2019-08-05 MED ORDER — SODIUM CHLORIDE 0.9 % IV SOLN: INTRAVENOUS | Status: DC

## 2019-08-05 MED ORDER — DONEPEZIL HCL 5 MG PO TABS
10.0000 mg | ORAL_TABLET | Freq: Every day | ORAL | Status: DC
  Administered 2019-08-06 – 2019-08-08 (×2): 10 mg via ORAL
  Filled 2019-08-05 (×4): qty 2

## 2019-08-05 MED ORDER — ONDANSETRON HCL 4 MG/2ML IJ SOLN: 4.0000 mg | Freq: Four times a day (QID) | INTRAMUSCULAR | Status: DC | PRN

## 2019-08-05 MED ORDER — VANCOMYCIN HCL 1250 MG/250ML IV SOLN
1250.0000 mg | Freq: Two times a day (BID) | INTRAVENOUS | Status: DC
  Administered 2019-08-05 – 2019-08-07 (×5): 1250 mg via INTRAVENOUS
  Filled 2019-08-05 (×9): qty 250

## 2019-08-05 MED ORDER — ACETAMINOPHEN 325 MG PO TABS
650.0000 mg | ORAL_TABLET | Freq: Four times a day (QID) | ORAL | Status: DC | PRN
  Administered 2019-08-07 (×2): 650 mg via ORAL
  Filled 2019-08-05 (×2): qty 2

## 2019-08-05 MED ORDER — LEVOTHYROXINE SODIUM 25 MCG PO TABS
125.0000 ug | ORAL_TABLET | Freq: Every day | ORAL | Status: DC
  Administered 2019-08-05 – 2019-08-09 (×5): 125 ug via ORAL
  Filled 2019-08-05 (×3): qty 1
  Filled 2019-08-05: qty 3
  Filled 2019-08-05: qty 1

## 2019-08-05 MED ORDER — SODIUM CHLORIDE 0.9 % IV SOLN
1.5000 g | Freq: Four times a day (QID) | INTRAVENOUS | Status: DC
  Administered 2019-08-05: 1.5 g via INTRAVENOUS
  Filled 2019-08-05 (×3): qty 4

## 2019-08-05 MED ORDER — ACETAMINOPHEN 650 MG RE SUPP: 650.0000 mg | Freq: Four times a day (QID) | RECTAL | Status: DC | PRN

## 2019-08-05 MED ORDER — VANCOMYCIN HCL 2000 MG/400ML IV SOLN
2000.0000 mg | INTRAVENOUS | Status: DC
  Filled 2019-08-05: qty 400

## 2019-08-05 MED ORDER — SODIUM CHLORIDE 0.9 % IV BOLUS
1000.0000 mL | Freq: Once | INTRAVENOUS | Status: AC
  Administered 2019-08-05: 1000 mL via INTRAVENOUS

## 2019-08-05 MED ORDER — ONDANSETRON HCL 4 MG PO TABS: 4.0000 mg | ORAL_TABLET | Freq: Four times a day (QID) | ORAL | Status: DC | PRN

## 2019-08-05 MED ORDER — SODIUM CHLORIDE 0.9 % IV SOLN
3.0000 g | Freq: Four times a day (QID) | INTRAVENOUS | Status: DC
  Administered 2019-08-05 – 2019-08-08 (×13): 3 g via INTRAVENOUS
  Filled 2019-08-05 (×2): qty 8
  Filled 2019-08-05: qty 3
  Filled 2019-08-05 (×2): qty 8
  Filled 2019-08-05 (×3): qty 3
  Filled 2019-08-05: qty 8
  Filled 2019-08-05: qty 3
  Filled 2019-08-05 (×3): qty 8
  Filled 2019-08-05 (×2): qty 3

## 2019-08-05 MED ORDER — MEMANTINE HCL 5 MG PO TABS
10.0000 mg | ORAL_TABLET | Freq: Two times a day (BID) | ORAL | Status: DC
  Administered 2019-08-05 – 2019-08-09 (×7): 10 mg via ORAL
  Filled 2019-08-05 (×9): qty 2

## 2019-08-05 NOTE — Progress Notes (Signed)
PROGRESS NOTE    Parker Chambers  WVP:[id number] DOB: [birth year] DOA: 08/04/2019 PCP: Wilford Corner, PA-C    Brief Narrative:  This is a 69 year old male with dementia resident of an independent living center.  He was sent in because he is getting progressively weaker and confused and today was noted to be covered in feces.   In the ER the patient is hypoxic satting in the high 80s.  He appears to have rhabdo.  And also appears to be septic.  He has lactic acidosis, is tachycardic and tachypneic.  He is unable to provide any history due to his dementia     Consultants:    Procedures:   Antimicrobials:   Zosyn, Vanco, valacyclovir   Subjective: Pt not too verbal, wife at bedside. She doesn't report anything as far as complaints this am  Objective: Vitals:   08/05/19 1100 08/05/19 1230 08/05/19 1300 08/05/19 1330  BP: 95/79 136/80 126/77 125/78  Pulse: 89 86 95 80  Resp:      Temp:      TempSrc:      SpO2: 95% 94% (!) 89% 94%  Weight:      Height:        Intake/Output Summary (Last 24 hours) at 08/05/2019 1401 Last data filed at 08/05/2019 1033 Gross per 24 hour  Intake 3694.95 ml  Output 450 ml  Net 3244.95 ml   Filed Weights   08/04/19 1944  Weight: (!) 140.6 kg    Examination:  General exam: Appears calm and comfortable , demented Chest: patch of red blistered area of LU chest area. cta no rales Cardiovascular system: S1 & S2 heard, RRR. No JVD, murmurs, rubs, gallops or clicks.  Gastrointestinal system: Abdomen is nondistended, soft and nontender.  Normal bowel sounds heard. Central nervous system:unable to assess Extremities: no edema Psychiatry: Mood & affect appropriate in current demented setting.     Data Reviewed: I have personally reviewed following labs and imaging studies  CBC: Recent Labs  Lab 08/04/19 1949 08/05/19 0023 08/05/19 0433  WBC 24.5* 19.1* 16.5*  NEUTROABS 22.8*  --   --   HGB 17.5* 16.0 15.1  HCT 53.0* 47.2  47.2  MCV 89.5 88.2 92.2  PLT 292 232 210   Basic Metabolic Panel: Recent Labs  Lab 08/04/19 1949 08/05/19 0023 08/05/19 0433  NA 140  --  142  K 3.7  --  4.1  CL 103  --  108  CO2 23  --  26  GLUCOSE 176*  --  114*  BUN 21  --  18  CREATININE 1.26* 1.07 1.00  CALCIUM 9.1  --  8.2*   GFR: Estimated Creatinine Clearance: 101.4 mL/min (by C-G formula based on SCr of 1 mg/dL). Liver Function Tests: Recent Labs  Lab 08/04/19 1949 08/05/19 0433  AST 58* 48*  ALT 23 21  ALKPHOS 70 54  BILITOT 1.3* 1.0  PROT 7.3 6.4*  ALBUMIN 4.1 3.4*   No results for input(s): LIPASE, AMYLASE in the last 168 hours. No results for input(s): AMMONIA in the last 168 hours. Coagulation Profile: No results for input(s): INR, PROTIME in the last 168 hours. Cardiac Enzymes: Recent Labs  Lab 08/04/19 1949 08/05/19 0433  CKTOTAL 2,609* 1,603*   BNP (last 3 results) No results for input(s): PROBNP in the last 8760 hours. HbA1C: No results for input(s): HGBA1C in the last 72 hours. CBG: No results for input(s): GLUCAP in the last 168 hours. Lipid Profile: No results for input(s):  CHOL, HDL, LDLCALC, TRIG, CHOLHDL, LDLDIRECT in the last 72 hours. Thyroid Function Tests: No results for input(s): TSH, T4TOTAL, FREET4, T3FREE, THYROIDAB in the last 72 hours. Anemia Panel: No results for input(s): VITAMINB12, FOLATE, FERRITIN, TIBC, IRON, RETICCTPCT in the last 72 hours. Sepsis Labs: Recent Labs  Lab 08/04/19 1949 08/04/19 2135 08/05/19 0227 08/05/19 0433  LATICACIDVEN 3.3* 3.2* 1.4 1.3    Recent Results (from the past 240 hour(s))  Blood culture (routine x 2)     Status: None (Preliminary result)   Collection Time: 08/04/19  7:49 PM   Specimen: BLOOD  Result Value Ref Range Status   Specimen Description BLOOD BLOOD RIGHT HAND  Final   Special Requests   Final    BOTTLES DRAWN AEROBIC AND ANAEROBIC Blood Culture results may not be optimal due to an excessive volume of blood received  in culture bottles   Culture   Final    NO GROWTH < 12 HOURS Performed at [facility], [address] [place], [place] [address]    Report Status PENDING  Incomplete  Blood culture (routine x 2)     Status: None (Preliminary result)   Collection Time: 08/04/19  7:49 PM   Specimen: BLOOD  Result Value Ref Range Status   Specimen Description BLOOD BLOOD LEFT FOREARM  Final   Special Requests   Final    BOTTLES DRAWN AEROBIC AND ANAEROBIC Blood Culture adequate volume   Culture   Final    NO GROWTH < 12 HOURS Performed at [facility], [address], [place], [place] [address]    Report Status PENDING  Incomplete  SARS Coronavirus 2 by RT PCR (hospital order, performed in [facility] hospital lab) Nasopharyngeal Nasopharyngeal Swab     Status: None   Collection Time: 08/04/19  7:49 PM   Specimen: Nasopharyngeal Swab  Result Value Ref Range Status   SARS Coronavirus 2 NEGATIVE NEGATIVE Final    Comment: (NOTE) SARS-CoV-2 target nucleic acids are NOT DETECTED. The SARS-CoV-2 RNA is generally detectable in upper and lower respiratory specimens during the acute phase of infection. The lowest concentration of SARS-CoV-2 viral copies this assay can detect is 250 copies / mL. A negative result does not preclude SARS-CoV-2 infection and should not be used as the sole basis for treatment or other patient management decisions.  A negative result may occur with improper specimen collection / handling, submission of specimen other than nasopharyngeal swab, presence of viral mutation(s) within the areas targeted by this assay, and inadequate number of viral copies (<250 copies / mL). A negative result must be combined with clinical observations, patient history, and epidemiological information. Fact Sheet for Patients:   BoilerBrush.com.cy Fact Sheet for Healthcare Providers: https://pope.com/ This test is not yet approved  or cleared  by the [place] FDA and has been authorized for detection and/or diagnosis of SARS-CoV-2 by FDA under an Emergency Use Authorization (EUA).  This EUA will remain in effect (meaning this test can be used) for the duration of the COVID-19 declaration under Section 564(b)(1) of the Act, 21 U.S.C. section 360bbb-3(b)(1), unless the authorization is terminated or revoked sooner. Performed at [facility] [address], [place], [place] [address]          Radiology Studies: CT Head Wo Contrast  Result Date: 08/04/2019 CLINICAL DATA:  Transient ischemic attack. History of dementia. EXAM: CT HEAD WITHOUT CONTRAST TECHNIQUE: Contiguous axial images were obtained from the base of the skull through the vertex without  intravenous contrast. COMPARISON:  None. FINDINGS: Brain: No evidence of acute infarction, hemorrhage, hydrocephalus, extra-axial collection or mass lesion/mass effect. Atrophy and extensive chronic microvascular ischemic change. 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 Vascular: No hyperdense vessel or unexpected calcification. Skull: Normal. Negative for fracture or focal lesion. Sinuses/Orbits: No acute finding. Chronic appearing mucosal thickening and small amount of fluid in the dependent RIGHT maxillary sinus. Other: None. IMPRESSION: 1. No acute intracranial abnormality. 2. Atrophy and extensive chronic microvascular ischemic change. 3. 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 4. Mild RIGHT maxillary sinus disease. Electronically Signed   By: Donzetta KohutGeoffrey  Wile M.D.   On: 08/04/2019 21:13   CT CHEST WO CONTRAST  Result Date: 08/04/2019 CLINICAL DATA:  Hypoxia. EXAM: CT CHEST WITHOUT CONTRAST TECHNIQUE: Multidetector CT imaging of the chest was performed following the standard protocol without IV contrast. COMPARISON:  Radiograph earlier this day. No prior exams available. FINDINGS: Cardiovascular: Atherosclerosis of the thoracic aorta. No  aneurysm. Upper normal heart size with coronary artery calcifications. Trace pericardial fluid may be physiologic versus small effusion. Mediastinum/Nodes: Motion artifact through the hila and lack of contrast limits assessment for hilar adenopathy. There are small mediastinal lymph nodes that are not enlarged by size criteria. No thyroid nodule. Decompressed esophagus. Small hiatal hernia. Lungs/Pleura: Breathing motion artifact limits parenchymal assessment. 9 mm subpleural nodular opacity in the right lung apex, series 3, image 33. Dependent linear opacities in the right lower lobe, favor atelectasis. There are patchy and linear opacities in the left lower lobe and lingula. Suggestion of mucus/retained debris in the right mainstem bronchus, motion limits more detailed assessment. No pleural fluid. No pulmonary edema. Upper Abdomen: No acute findings allowing for motion. Ingested material within the stomach. Musculoskeletal: There are no acute or suspicious osseous abnormalities. Degenerative change in the spine. IMPRESSION: 1. Breathing motion artifact limits assessment. 2. Dependent opacities in both lower lobes and lingula, atelectasis versus less likely pneumonia or aspiration. There is retained mucus/debris in the right mainstem bronchus. 3. Subpleural nodular opacity in the right lung apex measures 9 mm. Recommend correlation with any prior exams of performed elsewhere. In the absence of prior exams, follow-up CT in 3 months recommended for reassessment. 4. Coronary artery calcifications. Aortic Atherosclerosis (ICD10-I70.0). Electronically Signed   By: Narda RutherfordMelanie  Sanford M.D.   On: 08/04/2019 22:54   CT PELVIS WO CONTRAST  Result Date: 08/04/2019 CLINICAL DATA:  Pelvic trauma EXAM: CT PELVIS WITHOUT CONTRAST TECHNIQUE: Multidetector CT imaging of the pelvis was performed following the standard protocol without intravenous contrast. COMPARISON:  Radiograph 08/04/2019 FINDINGS: Urinary Tract: Distal ureters  are unremarkable. Urinary bladder is largely decompressed at the time of exam and therefore poorly evaluated by CT imaging. No gross bladder abnormality. Bowel: Unremarkable visualized pelvic bowel loops. No evidence of obstruction. Vascular/Lymphatic: Atherosclerotic calcification of the iliac arteries and bilateral common femoral, left profundus femoral and right superficial femoral arteries. No aneurysm or ectasia. No worrisome lymph nodes. Reproductive: Mild prostatomegaly with coarse, eccentric calcifications of the prostate. Seminal vesicles are unremarkable. Nonspecific calcifications along the corpora of the penile base, likely vascular. Other: Tiny fat containing umbilical hernia. Small bilateral fat containing inguinal hernias as well. No traumatic body wall dehiscence, contusive change, or hematoma. Musculoskeletal: No visible right femoral fracture. Lucency on the comparison radiograph may have been a projectional anomaly. Both femora are intact and normally seated within the acetabuli. There are enthesopathic changes about the greater and lesser trochanters including a corticated and the so  fight versus joint body along the posterior femoral neck on the right. The bilateral hips demonstrate moderate periarticular degenerative change with some femoral over coverage which may predispose the patient to femoral impingement syndromes. The bony pelvis is intact and congruent. Near complete bony fusion of the SI joints with some underlying mild sclerotic changes. Enthesopathic changes upon the iliac crest and ischial tuberosities as well. Discogenic changes also noted in the included lumbar levels with posterior disc bulging and facet arthropathy. IMPRESSION: 1. No visible right femoral or pelvic fracture. Lucency on the comparison radiograph may have been a projectional anomaly. 2. Moderate bilateral femoroacetabular degenerative chain with acetabular overcoverage of the femoral heads, which may predispose  the patient to femoral impingement syndromes. Correlate with clinical symptoms. 3. Ankylosis of the SI joints. 4. Degenerative changes in the lumbar spine. 5. Prostatomegaly. 6. Aortic Atherosclerosis (ICD10-I70.0). Electronically Signed   By: Lovena Le M.D.   On: 08/04/2019 21:25   DG Pelvis Portable  Result Date: 08/04/2019 CLINICAL DATA:  Fall with altered mental status EXAM: PORTABLE PELVIS 1-2 VIEWS COMPARISON:  None. FINDINGS: SI joints are non widened. Pubic symphysis and rami are intact. Both femoral heads project in joint. Questionable linear lucency within the greater trochanter of the right femur. IMPRESSION: Questionable lucency within the greater trochanter of the right femur. Recommend dedicated views of the right hip. Electronically Signed   By: Donavan Foil M.D.   On: 08/04/2019 20:53   DG Chest Port 1 View  Result Date: 08/04/2019 CLINICAL DATA:  Fall, altered mental status EXAM: PORTABLE CHEST 1 VIEW COMPARISON:  None. FINDINGS: Low lung volumes with streaky basilar opacities likely reflecting atelectasis. A more bandlike opacity in the periphery of the left lung base could reflect further subsegmental atelectatic change. Central vascular crowding. No consolidation, features of edema, pneumothorax, or effusion. The aorta is calcified. The remaining cardiomediastinal contours are unremarkable. No acute osseous or soft tissue abnormality. Degenerative changes are present in the imaged spine and shoulders. Telemetry leads overlie the chest. IMPRESSION: Low lung volumes with basilar atelectasis. No other acute cardiopulmonary abnormality. Aortic Atherosclerosis (ICD10-I70.0). Electronically Signed   By: Lovena Le M.D.   On: 08/04/2019 20:52        Scheduled Meds: . donepezil  10 mg Oral QHS  . enoxaparin (LOVENOX) injection  40 mg Subcutaneous Q12H  . levothyroxine  125 mcg Oral QAC breakfast  . memantine  10 mg Oral BID  . valACYclovir  1,000 mg Oral Daily   Continuous  Infusions: . sodium chloride 100 mL/hr at 08/05/19 0438  . ampicillin-sulbactam (UNASYN) IV Stopped (08/05/19 0835)  . vancomycin Stopped (08/05/19 1033)    Assessment & Plan:   Principal Problem:   Sepsis ([place]) Active Problems:   Dementia ([place])   Hypothyroidism    Sepsis ([facility])/lactic acidosis/pneumonia, concern for aspiration -Blood cultures x2pending -N.p.o., gentle IV fluid hydration -IV Unasyn -Swallow evaluation -Oxygen, nebulizers -Repeat lactic acid level improved with hydration  cellulitis groin/?shingles left chest wall -No report of diarrhea -continue  IV Vanco and p.o. valacyclovir Ck mrsa pcr  Rhabdomyolysis -Gentle IV fluid hydration -Follow CKs improving  Elevated troponin- mildly  , likely demand ischemia  -echo pending  Hypothyroidism -check TSH  Dementia -stable, home meds resumed   DVT prophylaxis: lovenox Code Status:full Family Communication: wife at bedside updated Disposition Plan: back to previous home life Barrier: requiring iv abx,as cant tolerate po yet       LOS: 0 days   Time spent: 45 min  with >50% coc    Lynn Ito, MD [facility] Pager [phone]  If 7PM-7AM, please contact night-coverage [email or web address] Password [email or web address] 08/05/2019, 2:01 PM

## 2019-08-05 NOTE — ED Notes (Signed)
Assumed care of patient patient sleeping vss, ns infusing. Safety maintained will monitor.

## 2019-08-05 NOTE — ED Notes (Signed)
Pt urinated in bed. Pt dirty linen removed and new linen placed. Pt cleaned as well and condom catheter placed. Barrier cream also placed on pt.

## 2019-08-05 NOTE — ED Notes (Signed)
Pt is up and is talking. Pt is now answering all questions.

## 2019-08-05 NOTE — Progress Notes (Signed)
Patient is non-redirectable, combative when touch. Patient is trying to pull out IVs, hitting staff, mittens have been placed on patient. Messaged Dr. Marylu Lund and she said it was okay to order a 1:1 sitter for safety.

## 2019-08-05 NOTE — Progress Notes (Signed)
*  PRELIMINARY RESULTS* Echocardiogram 2D Echocardiogram has been performed.  Parker Chambers Parker Chambers 08/05/2019, 11:08 AM

## 2019-08-05 NOTE — ED Notes (Signed)
ABX infusing, vss. Patient sleeping. Safety maintained.

## 2019-08-05 NOTE — ED Notes (Signed)
Waiting on unasyn from pharmacy 

## 2019-08-05 NOTE — Evaluation (Signed)
Clinical/Bedside Swallow Evaluation Patient Details  Name: Parker Chambers MRN: [id number] Date of Birth: 12/22/1950  Today's Date: 08/05/2019 Time: SLP Start Time (ACUTE ONLY): 1315 SLP Stop Time (ACUTE ONLY): 1415 SLP Time Calculation (min) (ACUTE ONLY): 60 min  Past Medical History:  Past Medical History:  Diagnosis Date  . Dementia ([place])   . Thyroid disease    Past Surgical History:  Past Surgical History:  Procedure Laterality Date  . HEMORRHOID SURGERY     HPI:  Pt is a 69 y/o male w/ dx'd Advanced Dementia, hypothyroidism, and Vit D defficiency.  Also reported in Outpt MD notes: chronic cough.  His Dementia began approximately 18 years ago. Per his wife he has had an extensive work-up in [place] and was managed by neurology there. He and his Wife recently moved from [place]; he resides at [facility] w/ caregiver support.  He is needs assistance with most of his ADLs including bathing and has to wear depends. He is able to feed himself. In general he is very pleasant and easygoing but at times does get agitated.  Pt "eats well" per report, noted BMI.  Pt was admitted w/ Sepsis, cellulitis groin/?shingles left chest wall, Rhabdomyolysis.    Assessment / Plan / Recommendation Clinical Impression  Pt appears to present w/ adequate oropharyngeal phase swallowing function w/ no gross oropharyngeal phase dysphagia appreciated; no neuromuscular swallowing deficits appreciated. Of note, pt exhibited Mild lingual and UE tremors at baseline and during volitional tasks. Due to Baseline Dementia/Cognitive decline, he also required Mod+ verbal/tactile cues in order to follow through w/ tasks such as holding his own Cup for drinking. Pt appears at reduced risk for prandial aspiration following general aspiration precautions and monitoring/support at meals d/t Cognitive decline. Unsure if pt has any baseline of REFLUX; noted "Small hiatal hernia" and charted Chronic Cough, phelgm in Outpatient MD  notes. Pt is strongly recommended to f/u w/ MD/GI to r/o Esophageal dysmotility. Pt is not on a PPI.Pt positioned upright in bed then presented po trials. He consumed trials of thin liquids, puree, and soft solids w/ no overt clinical s/s of aspiration noted; clear vocal quality b/t trials and no decline in O2 sats, or respiratory effort. Oral phase appeared [facility] for bolus management, mastication, and A-P transfer of po's for swallow. Oral clearing achieved. OM exam was [facility] for oral clearing; lingual/labial movements. No unilateral weakness. Recommend a Regluar diet(w/ mech soft meats, moistened foods) w/ thin liquids; general aspiration precautions.GeneralREFLUX precautions strongly recommended to lessen chance for Regurgitation.Recommend pt f/u w/ GI for assessment to r/o Reflux and/or tx as indicated. MD updated. NSG to reconsult ST services if any new needs while admitted.  SLP Visit Diagnosis: Dysphagia, unspecified (R13.10);Cognitive communication deficit (R41.841)(impacted by his Cognitive decline, Dementia)    Aspiration Risk  (reduced following general precautions)    Diet Recommendation  more of a Mech Soft diet w/ cut meats, moistened foods; Thin liquids. General aspiration and Reflux precautions. Support at meals d/t baseline Cognitive decline.  Medication Administration: Whole meds with liquid(or Whole in Puree if needed for easier swallowing)    Other  Recommendations Recommended Consults: Consider GI evaluation;Consider esophageal assessment(Dietician f/u) Oral Care Recommendations: Oral care BID;Oral care before and after PO;Staff/trained caregiver to provide oral care Other Recommendations: (n/a)   Follow up Recommendations None      Frequency and Duration (n/a)  (n/a)       Prognosis Prognosis for Safe Diet Advancement: Good Barriers to Reach Goals: Cognitive deficits;Time post onset;Severity  of deficits      Swallow Study   General Date of Onset: 08/04/19 HPI: Pt is  a 69 y/o male w/ dx'd Advanced Dementia, hypothyroidism, and Vit D defficiency.  Also reported in Outpt MD notes: chronic cough.  His Dementia began approximately 18 years ago. Per his wife he has had an extensive work-up in [place] and was managed by neurology there. He and his Wife recently moved from [place]; he resides at [facility] w/ caregiver support.  He is needs assistance with most of his ADLs including bathing and has to wear depends. He is able to feed himself. In general he is very pleasant and easygoing but at times does get agitated.  Pt "eats well" per report, noted BMI.  Pt was admitted w/ Sepsis, cellulitis groin/?shingles left chest wall, Rhabdomyolysis.  Type of Study: Bedside Swallow Evaluation Previous Swallow Assessment: none noted Diet Prior to this Study: NPO Temperature Spikes Noted: No(wbc 16.5) Respiratory Status: Room air(not wearing his O2) History of Recent Intubation: No Behavior/Cognition: Alert;Cooperative;Pleasant mood;Confused;Distractible;Requires cueing(mostly nonverbal) Oral Cavity Assessment: Within Functional Limits Oral Care Completed by SLP: Yes Oral Cavity - Dentition: Adequate natural dentition Vision: Functional for self-feeding Self-Feeding Abilities: Able to feed self;Needs assist;Needs set up(needed support) Patient Positioning: Upright in bed(needed positioning) Baseline Vocal Quality: Normal(but few verbalizations) Volitional Cough: Cognitively unable to elicit Volitional Swallow: Unable to elicit    Oral/Motor/Sensory Function Overall Oral Motor/Sensory Function: Within functional limits(though lingual tremors noted)   Ice Chips Ice chips: Within functional limits Presentation: Spoon(fed; 3 trials)   Thin Liquid Thin Liquid: Within functional limits Presentation: Self Fed;Cup;Straw(1 via cup; 8 via straw) Other Comments: pt held cup and fed self w/ cues    Nectar Thick Nectar Thick Liquid: Not tested   Honey Thick Honey Thick Liquid:  Not tested   Puree Puree: Within functional limits Presentation: Spoon(fed; 3 trials) Other Comments: pt did not seem to like it   Solid     Solid: Within functional limits(grossly) Presentation: Spoon(fed; 5 trials ) Other Comments: peanutbutter crackers       Jerilynn Som, MS, CCC-SLP Parker Chambers 08/05/2019,2:27 PM

## 2019-08-05 NOTE — ED Notes (Signed)
condom cath dislodged patient incontinent of urine. Confused but able to follow command. Peri care provided/.linen changed and condom cath replaced. S/o at bedside. Scheduled meds given. Patient able to swallow pills with coaching. Drinks through straw with no chocking episodes.

## 2019-08-05 NOTE — ED Notes (Signed)
Provider states to hold PO medications till pt can safely take.

## 2019-08-05 NOTE — ED Notes (Signed)
Provider notified that pt is unable to follow commands and that this RN does not believe PO medications can safely be given.

## 2019-08-05 NOTE — Progress Notes (Signed)
PHARMACIST - PHYSICIAN COMMUNICATION  CONCERNING:  Enoxaparin (Lovenox) for DVT Prophylaxis    RECOMMENDATION: Patient was prescribed enoxaprin 40mg  q24 hours for VTE prophylaxis.   Filed Weights   08/04/19 1944  Weight: (!) 140.6 kg (310 lb)    Body mass index is 42.04 kg/m.  Estimated Creatinine Clearance: 80.5 mL/min (A) (by C-G formula based on SCr of 1.26 mg/dL (H)).  Based on [facility] policy patient is candidate for enoxaparin 40mg  every 12 hour dosing due to BMI being >40.  DESCRIPTION: Pharmacy has adjusted enoxaparin dose per [facility] policy.  Patient is now receiving enoxaparin 40mg  every 12 hours.    , PharmD Clinical Pharmacist  08/05/2019 12:24 AM

## 2019-08-05 NOTE — Progress Notes (Signed)
Pharmacy Antibiotic Note  Parker Chambers is a 69 y.o. male admitted on 08/04/2019 with cellulitis.  Pharmacy has been consulted for Vancomycin dosing.  Pt received 1gm dose in ED  Plan: Pt received only a 1000mg  load, will start 1250mg  q12h now Per Vancomycin nomogram  Will adjust Unasyn dosing to 3g q6h (for aspiration PNA)  Height: 6' (182.9 cm) Weight: (!) 140.6 kg (310 lb) IBW/kg (Calculated) : 77.6  Temp (24hrs), Avg:100 F (37.8 C), Min:98.7 F (37.1 C), Max:101.3 F (38.5 C)  Recent Labs  Lab 08/04/19 1949 08/04/19 2135 08/05/19 0023 08/05/19 0227 08/05/19 0433  WBC 24.5*  --  19.1*  --  16.5*  CREATININE 1.26*  --  1.07  --  1.00  LATICACIDVEN 3.3* 3.2*  --  1.4 1.3    Estimated Creatinine Clearance: 101.4 mL/min (by C-G formula based on SCr of 1 mg/dL).    No Known Allergies  Antimicrobials this admission: Zosyn 5/16 x 1  Unasyn 5/17 >>  Vancomycin 5/17 >>   Dose adjustments this admission: Vancomycin 2000mg  q24 to 1250mg  q12  Microbiology results:  BCx: pending  UCx: pending   Thank you for allowing pharmacy to be a part of this patient's care.  6/17, PharmD, BCPS Clinical Pharmacist 08/05/2019 7:55 AM

## 2019-08-05 NOTE — Progress Notes (Signed)
Pharmacy Antibiotic Note  Parker Chambers is a 69 y.o. male admitted on 08/04/2019 with cellulitis.  Pharmacy has been consulted for Vancomycin dosing.  Pt received 1gm dose in ED  Plan: Vancomycin 2000 mg IV Q 24 hrs. Goal AUC 400-550. Expected AUC: 519.1, Css min 11.6 SCr used: 1.26 Since pt did not receive a full loading dose will start the next dose a bit early  Height: 6' (182.9 cm) Weight: (!) 140.6 kg (310 lb) IBW/kg (Calculated) : 77.6  Temp (24hrs), Avg:100.5 F (38.1 C), Min:99.1 F (37.3 C), Max:101.3 F (38.5 C)  Recent Labs  Lab 08/04/19 1949 08/04/19 2135  WBC 24.5*  --   CREATININE 1.26*  --   LATICACIDVEN 3.3* 3.2*    Estimated Creatinine Clearance: 80.5 mL/min (A) (by C-G formula based on SCr of 1.26 mg/dL (H)).    No Known Allergies  Antimicrobials this admission:   >>    >>   Dose adjustments this admission:   Microbiology results:  BCx:   UCx:    Sputum:    MRSA PCR:   Thank you for allowing pharmacy to be a part of this patient's care.  Valrie Hart A 08/05/2019 12:20 AM

## 2019-08-06 DIAGNOSIS — R778 Other specified abnormalities of plasma proteins: Secondary | ICD-10-CM

## 2019-08-06 LAB — CBC
HCT: 42.7 % (ref 39.0–52.0)
Hemoglobin: 13.9 g/dL (ref 13.0–17.0)
MCH: 30.1 pg (ref 26.0–34.0)
MCHC: 32.6 g/dL (ref 30.0–36.0)
MCV: 92.4 fL (ref 80.0–100.0)
Platelets: 212 10*3/uL (ref 150–400)
RBC: 4.62 MIL/uL (ref 4.22–5.81)
RDW: 12.9 % (ref 11.5–15.5)
WBC: 11.8 10*3/uL — ABNORMAL HIGH (ref 4.0–10.5)
nRBC: 0 % (ref 0.0–0.2)

## 2019-08-06 LAB — BASIC METABOLIC PANEL
Anion gap: 8 (ref 5–15)
BUN: 15 mg/dL (ref 8–23)
CO2: 27 mmol/L (ref 22–32)
Calcium: 8.3 mg/dL — ABNORMAL LOW (ref 8.9–10.3)
Chloride: 107 mmol/L (ref 98–111)
Creatinine, Ser: 0.88 mg/dL (ref 0.61–1.24)
GFR calc Af Amer: >60 mL/min (ref >60)
GFR calc non Af Amer: >60 mL/min (ref >60)
Glucose, Bld: 95 mg/dL (ref 70–99)
Potassium: 3.9 mmol/L (ref 3.5–5.1)
Sodium: 142 mmol/L (ref 135–145)

## 2019-08-06 LAB — THYROID PANEL WITH TSH
Free Thyroxine Index: 1.6 (ref 1.2–4.9)
T3 Uptake Ratio: 27 % (ref 24–39)
T4, Total: 6.1 ug/dL (ref 4.5–12.0)
TSH: 5.88 u[IU]/mL — ABNORMAL HIGH (ref 0.450–4.500)

## 2019-08-06 LAB — CK: Total CK: 692 U/L — ABNORMAL HIGH (ref 49–397)

## 2019-08-06 LAB — MRSA PCR SCREENING: MRSA by PCR: NEGATIVE

## 2019-08-06 NOTE — Evaluation (Signed)
Occupational Therapy Evaluation Patient Details Name: Parker Chambers MRN: [id number] DOB: Jan 15, 1951 Today's Date: 08/06/2019    History of Present Illness Pt is a 69 year old male with dementia  for past 18 years and resident of an independent living center ([facility]). He was diagnosed with sepsis (HCC)/lactic acidosis/pneumonia, concern for aspiration and rhabdomyolysis.  Questionable lucency within the greater trochanter of the right femur with rec for dedicated views of the right hip. cellulitis  In pubic region, redness on face and blister left chest wall with concern for possible shingles and on air borne precautions as well as isolation precautions. Also reported in Outpt MD notes: chronic cough. Per his wife he has had an extensive work-up in [place] and was managed by neurology there. He and his Wife recently moved from [place]; he resides at [facility] w/ caregiver support.   Clinical Impression   Pt is 69 year old male with hx of advanced dementia  (18 yrs) who presents with sepsis, pneumonia and rhabdomyolysis (see above for presenting problems).  Pt presents with a flat affect, difficulty expressing himself and following one step commands.  He holds himself in bed rolled to the right and guards in LLE with chart indicating need for xrays of R hip which have not been completed so no OOB activities performed.  He is alert and his wife is sitting bedside and a sitter, Hoyle Sauer, on other side and has B hand mitts on due to confusion and hx of being combative with NSG.  He has a delay in responses but cooperative.  He is on isolation and airborne precautions with possible shingles and mildly congested cough.  Pt is able to move B UE and hands but has decreased coordination and stamina for even hand to mouth for self feeding and may benefit from foam utensil holders to improve grasp and control.  Tasks requiring planning are difficult.  Rec continued OT while in hospital to continue  to work on increasing independence in ADLs, balance and functional mobility training, coordination exercises and family ed and training and SNF after discharge.    Follow Up Recommendations  SNF    Equipment Recommendations       Recommendations for Other Services       Precautions / Restrictions Precautions Precautions: Fall;Other (comment) Precaution Comments: Chart indicated rec for xrays for R hip due to questionable lucency 08-04-19.  Pt with hx of being combative and has B mitts in place. Restrictions Weight Bearing Restrictions: No      Mobility Bed Mobility                  Transfers                      Balance                                           ADL either performed or assessed with clinical judgement   ADL Overall ADL's : Needs assistance/impaired Eating/Feeding: Set up;Maximal assistance;Bed level Eating/Feeding Details (indicate cue type and reason): Pt is able to hold a cup or fork with set up and may benefit from adaptive utensil holders. Grooming: Wash/dry hands;Wash/dry face;Oral care;Maximal assistance;Bed level   Upper Body Bathing: Maximal assistance;Bed level   Lower Body Bathing: Total assistance;Bed level   Upper Body Dressing : Moderate assistance;Bed level   Lower Body Dressing:  Maximal assistance;Bed level               Functional mobility during ADLs: Maximal assistance General ADL Comments: Pt currently having difficulty with following one step directions and has expressive aphasia and followed simple one step directions 25% of session and his wife will respond to questions most of time since he has difficulty expressing himself.  He needs max to total assist for all ADLs at this time due to decreasd cognition and impaired visual fields to the R and stays rolled toward L side.     Vision Baseline Vision/History: Wears glasses Wears Glasses: At all times Additional Comments: unable to follow one  step directions to fully assess but could not track therapist walking from left side of bed to right or to look at board on wall with the date.     Perception     Praxis      Pertinent Vitals/Pain Pain Assessment: No/denies pain     Hand Dominance Right   Extremity/Trunk Assessment Upper Extremity Assessment Upper Extremity Assessment: Overall WFL for tasks assessed   Lower Extremity Assessment Lower Extremity Assessment: Defer to PT evaluation       Communication Communication Communication: Expressive difficulties;Other (comment)(pt may have receptive difficulties as well)   Cognition Arousal/Alertness: Awake/alert Behavior During Therapy: Flat affect;Restless Overall Cognitive Status: History of cognitive impairments - at baseline                                 General Comments: Pt with hx of advanced dementia for 18 years and recently moved from [place] with his wife.   General Comments       Exercises     Shoulder Instructions      Home Living Family/patient expects to be discharged to:: Assisted living                             Home Equipment: Walker - 2 wheels;Bedside commode;Tub bench;Grab bars - tub/shower   Additional Comments: Pt and his wife live at [facility] and have caregivers to help as well as seeing PT at facility.      Prior Functioning/Environment Level of Independence: Needs assistance  Gait / Transfers Assistance Needed: Pt's wife reported he was using a FWW or rollator for ambulation. ADL's / Homemaking Assistance Needed: Pt has caregivers to help with showers, dressing skills and was able to feed himself and participate in basic self care only per wife's report.            OT Problem List: Decreased activity tolerance;Impaired balance (sitting and/or standing);Decreased safety awareness;Decreased cognition;Decreased coordination;Decreased knowledge of use of DME or AE      OT  Treatment/Interventions: Self-care/ADL training;Therapeutic exercise;Patient/family education;Visual/perceptual remediation/compensation;DME and/or AE instruction;Therapeutic activities    OT Goals(Current goals can be found in the care plan section) Acute Rehab OT Goals Patient Stated Goal: pt is unable to state goal but his wife wants him to get back to PLOF and ambulate again. OT Goal Formulation: With patient/family Time For Goal Achievement: 08/20/19 Potential to Achieve Goals: Fair  OT Frequency: Min 2X/week   Barriers to D/C:            Co-evaluation              AM-PAC OT "6 Clicks" Daily Activity     Outcome Measure Help from another person eating meals?:  A Lot Help from another person taking care of personal grooming?: A Lot Help from another person toileting, which includes using toliet, bedpan, or urinal?: Total Help from another person bathing (including washing, rinsing, drying)?: Total Help from another person to put on and taking off regular upper body clothing?: A Lot Help from another person to put on and taking off regular lower body clothing?: Total 6 Click Score: 9   End of Session    Activity Tolerance: Patient tolerated treatment well Patient left: in bed;with call bell/phone within reach;with bed alarm set;with nursing/sitter in room;with family/visitor present  OT Visit Diagnosis: Other abnormalities of gait and mobility (R26.89);Cognitive communication deficit (R41.841);Other symptoms and signs involving the nervous system (R29.898) Symptoms and signs involving cognitive functions: Other cerebrovascular disease                Time: 1400-1435 OT Time Calculation (min): 35 min Charges:  OT General Charges $OT Visit: 1 Visit OT Evaluation $OT Eval Moderate Complexity: 1 Mod OT Treatments $Self Care/Home Management : 8-22 mins  Susanne Borders, OTR/L, [place] ascom [phone] 08/06/19, 3:13 PM

## 2019-08-06 NOTE — TOC Progression Note (Signed)
Transition of Care [facility]) - Progression Note    Patient Details  Name: Parker Chambers MRN: [id number] Date of Birth: Aug 09, 1950  Transition of [facility] CM/SW Contact  Lakiya Cottam, Lemar Livings, LCSW Phone Number: 08/06/2019, 9:37 AM  Clinical Narrative:   Spoke with [facility] [place] who reports pt lives with wife in independent living with hired 24 hr caregivers. They have moved recently from [place] to reside at [place]. Will touch base with wife and work on best plan for pt once medically ready for discharge.         Expected Discharge Plan and Services                                                 Social Determinants of Health (SDOH) Interventions    Readmission Risk Interventions No flowsheet data found.

## 2019-08-06 NOTE — Plan of Care (Signed)
  Problem: Health Behavior/Discharge Planning: Goal: Ability to manage health-related needs will improve Outcome: Progressing   Problem: Nutrition: Goal: Adequate nutrition will be maintained Outcome: Progressing   Problem: Coping: Goal: Level of anxiety will decrease Outcome: Progressing   Problem: Pain Managment: Goal: General experience of comfort will improve Outcome: Progressing   Problem: Safety: Goal: Ability to remain free from injury will improve Outcome: Progressing   Problem: Skin Integrity: Goal: Risk for impaired skin integrity will decrease Outcome: Progressing   

## 2019-08-06 NOTE — Progress Notes (Addendum)
PROGRESS NOTE    Parker Chambers  YTK:[id number] DOB: 12/01/1950 DOA: 08/04/2019 PCP: Wilford Corner, PA-C    Brief Narrative:  This is a 69 year old male with dementia resident of an independent living center.  He was sent in because he is getting progressively weaker and confused and today was noted to be covered in feces.   In the ER the patient is hypoxic satting in the high 80s.  He appears to have rhabdo.  And also appears to be septic.  He has lactic acidosis, is tachycardic and tachypneic.  He is unable to provide any history due to his dementia     Consultants:    Procedures:   Antimicrobials:   Zosyn, Vanco, valacyclovir   Subjective: Pt not too verbal, sitter in room. At baseline.  Objective: Vitals:   08/05/19 2026 08/06/19 0001 08/06/19 0415 08/06/19 0805  BP: 136/71 125/72 (!) 157/73 (!) 153/71  Pulse: 71 66 70   Resp: 18 17 16 19   Temp:   98.1 F (36.7 C) 98.5 F (36.9 C)  TempSrc:   Axillary Axillary  SpO2:      Weight:      Height:        Intake/Output Summary (Last 24 hours) at 08/06/2019 1225 Last data filed at 08/06/2019 0544 Gross per 24 hour  Intake 150 ml  Output 850 ml  Net -700 ml   Filed Weights   08/04/19 1944  Weight: (!) 140.6 kg    Examination:  General exam: Appears calm and comfortable , demented Chest: patch of red blistered area of LU chest area. cta no rales , mild rhonchi, improving Cardiovascular system: S1 & S2 heard, RRR. No JVD, murmurs, rubs, gallops or clicks.  Gastrointestinal system: Abdomen is nondistended, soft and nontender.  Normal bowel sounds heard. Central nervous system:unable to assess, demented. Does follow command much Extremities: no edema Psychiatry: Mood & affect appropriate in current demented setting.     Data Reviewed: I have personally reviewed following labs and imaging studies  CBC: Recent Labs  Lab 08/04/19 1949 08/05/19 0023 08/05/19 0433 08/06/19 0535  WBC 24.5* 19.1*  16.5* 11.8*  NEUTROABS 22.8*  --   --   --   HGB 17.5* 16.0 15.1 13.9  HCT 53.0* 47.2 47.2 42.7  MCV 89.5 88.2 92.2 92.4  PLT 292 232 210 212   Basic Metabolic Panel: Recent Labs  Lab 08/04/19 1949 08/05/19 0023 08/05/19 0433 08/06/19 0535  NA 140  --  142 142  K 3.7  --  4.1 3.9  CL 103  --  108 107  CO2 23  --  26 27  GLUCOSE 176*  --  114* 95  BUN 21  --  18 15  CREATININE 1.26* 1.07 1.00 0.88  CALCIUM 9.1  --  8.2* 8.3*   GFR: Estimated Creatinine Clearance: 115.2 mL/min (by C-G formula based on SCr of 0.88 mg/dL). Liver Function Tests: Recent Labs  Lab 08/04/19 1949 08/05/19 0433  AST 58* 48*  ALT 23 21  ALKPHOS 70 54  BILITOT 1.3* 1.0  PROT 7.3 6.4*  ALBUMIN 4.1 3.4*   No results for input(s): LIPASE, AMYLASE in the last 168 hours. No results for input(s): AMMONIA in the last 168 hours. Coagulation Profile: No results for input(s): INR, PROTIME in the last 168 hours. Cardiac Enzymes: Recent Labs  Lab 08/04/19 1949 08/05/19 0433 08/06/19 0535  CKTOTAL 2,609* 1,603* 692*   BNP (last 3 results) No results for input(s): PROBNP in the  last 8760 hours. HbA1C: No results for input(s): HGBA1C in the last 72 hours. CBG: No results for input(s): GLUCAP in the last 168 hours. Lipid Profile: No results for input(s): CHOL, HDL, LDLCALC, TRIG, CHOLHDL, LDLDIRECT in the last 72 hours. Thyroid Function Tests: Recent Labs    08/05/19 0023  TSH 5.880*  T4TOTAL 6.1   Anemia Panel: No results for input(s): VITAMINB12, FOLATE, FERRITIN, TIBC, IRON, RETICCTPCT in the last 72 hours. Sepsis Labs: Recent Labs  Lab 08/04/19 1949 08/04/19 2135 08/05/19 0227 08/05/19 0433  LATICACIDVEN 3.3* 3.2* 1.4 1.3    Recent Results (from the past 240 hour(s))  Blood culture (routine x 2)     Status: None (Preliminary result)   Collection Time: 08/04/19  7:49 PM   Specimen: BLOOD  Result Value Ref Range Status   Specimen Description BLOOD BLOOD RIGHT HAND  Final    Special Requests   Final    BOTTLES DRAWN AEROBIC AND ANAEROBIC Blood Culture results may not be optimal due to an excessive volume of blood received in culture bottles   Culture   Final    NO GROWTH 2 DAYS Performed at [facility], [address], [place], [place] [address]    Report Status PENDING  Incomplete  Blood culture (routine x 2)     Status: None (Preliminary result)   Collection Time: 08/04/19  7:49 PM   Specimen: BLOOD  Result Value Ref Range Status   Specimen Description BLOOD BLOOD LEFT FOREARM  Final   Special Requests   Final    BOTTLES DRAWN AEROBIC AND ANAEROBIC Blood Culture adequate volume   Culture   Final    NO GROWTH 2 DAYS Performed at [facility], [address], [place], [place] [address]    Report Status PENDING  Incomplete  SARS Coronavirus 2 by RT PCR (hospital order, performed in [facility] hospital lab) Nasopharyngeal Nasopharyngeal Swab     Status: None   Collection Time: 08/04/19  7:49 PM   Specimen: Nasopharyngeal Swab  Result Value Ref Range Status   SARS Coronavirus 2 NEGATIVE NEGATIVE Final    Comment: (NOTE) SARS-CoV-2 target nucleic acids are NOT DETECTED. The SARS-CoV-2 RNA is generally detectable in upper and lower respiratory specimens during the acute phase of infection. The lowest concentration of SARS-CoV-2 viral copies this assay can detect is 250 copies / mL. A negative result does not preclude SARS-CoV-2 infection and should not be used as the sole basis for treatment or other patient management decisions.  A negative result may occur with improper specimen collection / handling, submission of specimen other than nasopharyngeal swab, presence of viral mutation(s) within the areas targeted by this assay, and inadequate number of viral copies (<250 copies / mL). A negative result must be combined with clinical observations, patient history, and epidemiological information. Fact Sheet for Patients:     BoilerBrush.com.cy Fact Sheet for Healthcare Providers: https://pope.com/ This test is not yet approved or cleared  by the [place] FDA and has been authorized for detection and/or diagnosis of SARS-CoV-2 by FDA under an Emergency Use Authorization (EUA).  This EUA will remain in effect (meaning this test can be used) for the duration of the COVID-19 declaration under Section 564(b)(1) of the Act, 21 U.S.C. section 360bbb-3(b)(1), unless the authorization is terminated or revoked sooner. Performed at [facility], [address], [place], [place] [address]   Urine Culture     Status: None   Collection Time: 08/04/19  7:49 PM  Specimen: Urine, Random  Result Value Ref Range Status   Specimen Description   Final    URINE, RANDOM Performed at [facility], [address] [place], [place] [address]    Special Requests   Final    NONE Performed at [facility], [address], [place], [place] [address]    Culture   Final    NO GROWTH Performed at [facility], [address], [place], [place] [address]    Report Status 08/05/2019 FINAL  Final         Radiology Studies: CT Head Wo Contrast  Result Date: 08/04/2019 CLINICAL DATA:  Transient ischemic attack. History of dementia. EXAM: CT HEAD WITHOUT CONTRAST TECHNIQUE: Contiguous axial images were obtained from the base of the skull through the vertex without intravenous contrast. COMPARISON:  None. FINDINGS: Brain: No evidence of acute infarction, hemorrhage, hydrocephalus, extra-axial collection or mass lesion/mass effect. Atrophy and extensive chronic microvascular ischemic change. 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 Vascular: No hyperdense vessel or unexpected calcification. Skull: Normal. Negative for fracture or focal lesion. Sinuses/Orbits: No acute finding. Chronic appearing mucosal thickening and small amount of fluid  in the dependent RIGHT maxillary sinus. Other: None. IMPRESSION: 1. No acute intracranial abnormality. 2. Atrophy and extensive chronic microvascular ischemic change. 3. 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 4. Mild RIGHT maxillary sinus disease. Electronically Signed   By: Donzetta Kohut M.D.   On: 08/04/2019 21:13   CT CHEST WO CONTRAST  Result Date: 08/04/2019 CLINICAL DATA:  Hypoxia. EXAM: CT CHEST WITHOUT CONTRAST TECHNIQUE: Multidetector CT imaging of the chest was performed following the standard protocol without IV contrast. COMPARISON:  Radiograph earlier this day. No prior exams available. FINDINGS: Cardiovascular: Atherosclerosis of the thoracic aorta. No aneurysm. Upper normal heart size with coronary artery calcifications. Trace pericardial fluid may be physiologic versus small effusion. Mediastinum/Nodes: Motion artifact through the hila and lack of contrast limits assessment for hilar adenopathy. There are small mediastinal lymph nodes that are not enlarged by size criteria. No thyroid nodule. Decompressed esophagus. Small hiatal hernia. Lungs/Pleura: Breathing motion artifact limits parenchymal assessment. 9 mm subpleural nodular opacity in the right lung apex, series 3, image 33. Dependent linear opacities in the right lower lobe, favor atelectasis. There are patchy and linear opacities in the left lower lobe and lingula. Suggestion of mucus/retained debris in the right mainstem bronchus, motion limits more detailed assessment. No pleural fluid. No pulmonary edema. Upper Abdomen: No acute findings allowing for motion. Ingested material within the stomach. Musculoskeletal: There are no acute or suspicious osseous abnormalities. Degenerative change in the spine. IMPRESSION: 1. Breathing motion artifact limits assessment. 2. Dependent opacities in both lower lobes and lingula, atelectasis versus less likely pneumonia or aspiration. There is retained mucus/debris in the right mainstem  bronchus. 3. Subpleural nodular opacity in the right lung apex measures 9 mm. Recommend correlation with any prior exams of performed elsewhere. In the absence of prior exams, follow-up CT in 3 months recommended for reassessment. 4. Coronary artery calcifications. Aortic Atherosclerosis (ICD10-I70.0). Electronically Signed   By: Narda Rutherford M.D.   On: 08/04/2019 22:54   CT PELVIS WO CONTRAST  Result Date: 08/04/2019 CLINICAL DATA:  Pelvic trauma EXAM: CT PELVIS WITHOUT CONTRAST TECHNIQUE: Multidetector CT imaging of the pelvis was performed following the standard protocol without intravenous contrast. COMPARISON:  Radiograph 08/04/2019 FINDINGS: Urinary Tract: Distal ureters are unremarkable. Urinary bladder is largely decompressed at the time of exam  and therefore poorly evaluated by CT imaging. No gross bladder abnormality. Bowel: Unremarkable visualized pelvic bowel loops. No evidence of obstruction. Vascular/Lymphatic: Atherosclerotic calcification of the iliac arteries and bilateral common femoral, left profundus femoral and right superficial femoral arteries. No aneurysm or ectasia. No worrisome lymph nodes. Reproductive: Mild prostatomegaly with coarse, eccentric calcifications of the prostate. Seminal vesicles are unremarkable. Nonspecific calcifications along the corpora of the penile base, likely vascular. Other: Tiny fat containing umbilical hernia. Small bilateral fat containing inguinal hernias as well. No traumatic body wall dehiscence, contusive change, or hematoma. Musculoskeletal: No visible right femoral fracture. Lucency on the comparison radiograph may have been a projectional anomaly. Both femora are intact and normally seated within the acetabuli. There are enthesopathic changes about the greater and lesser trochanters including a corticated and the so fight versus joint body along the posterior femoral neck on the right. The bilateral hips demonstrate moderate periarticular  degenerative change with some femoral over coverage which may predispose the patient to femoral impingement syndromes. The bony pelvis is intact and congruent. Near complete bony fusion of the SI joints with some underlying mild sclerotic changes. Enthesopathic changes upon the iliac crest and ischial tuberosities as well. Discogenic changes also noted in the included lumbar levels with posterior disc bulging and facet arthropathy. IMPRESSION: 1. No visible right femoral or pelvic fracture. Lucency on the comparison radiograph may have been a projectional anomaly. 2. Moderate bilateral femoroacetabular degenerative chain with acetabular overcoverage of the femoral heads, which may predispose the patient to femoral impingement syndromes. Correlate with clinical symptoms. 3. Ankylosis of the SI joints. 4. Degenerative changes in the lumbar spine. 5. Prostatomegaly. 6. Aortic Atherosclerosis (ICD10-I70.0). Electronically Signed   By: Kreg ShropshirePrice  DeHay M.D.   On: 08/04/2019 21:25   DG Pelvis Portable  Result Date: 08/04/2019 CLINICAL DATA:  Fall with altered mental status EXAM: PORTABLE PELVIS 1-2 VIEWS COMPARISON:  None. FINDINGS: SI joints are non widened. Pubic symphysis and rami are intact. Both femoral heads project in joint. Questionable linear lucency within the greater trochanter of the right femur. IMPRESSION: Questionable lucency within the greater trochanter of the right femur. Recommend dedicated views of the right hip. Electronically Signed   By: Jasmine PangKim  Fujinaga M.D.   On: 08/04/2019 20:53   DG Chest Port 1 View  Result Date: 08/04/2019 CLINICAL DATA:  Fall, altered mental status EXAM: PORTABLE CHEST 1 VIEW COMPARISON:  None. FINDINGS: Low lung volumes with streaky basilar opacities likely reflecting atelectasis. A more bandlike opacity in the periphery of the left lung base could reflect further subsegmental atelectatic change. Central vascular crowding. No consolidation, features of edema, pneumothorax,  or effusion. The aorta is calcified. The remaining cardiomediastinal contours are unremarkable. No acute osseous or soft tissue abnormality. Degenerative changes are present in the imaged spine and shoulders. Telemetry leads overlie the chest. IMPRESSION: Low lung volumes with basilar atelectasis. No other acute cardiopulmonary abnormality. Aortic Atherosclerosis (ICD10-I70.0). Electronically Signed   By: Kreg ShropshirePrice  DeHay M.D.   On: 08/04/2019 20:52   ECHOCARDIOGRAM COMPLETE  Result Date: 08/05/2019    ECHOCARDIOGRAM REPORT   Patient Name:   Ave FilterSTEPHEN Padron Date of Exam: 08/05/2019 Medical Rec #:  [id number]      Height:       72.0 in Accession #:    [id number]     Weight:       310.0 lb Date of Birth:  [birth year]     BSA:          2.567 m Patient  Age:    68 years       BP:           142/80 mmHg Patient Gender: M              HR:           84 bpm. Exam Location:  [facility] Procedure: 2D Echo, Color Doppler and Cardiac Doppler Indications:     Troponin I above reference range  History:         Patient has no prior history of Echocardiogram examinations.                  Dementia.  Sonographer:     Humphrey Rolls RDCS (AE) Referring Phys:  [id number] DEBBY CROSLEY Diagnosing Phys: Adrian Blackwater MD  Sonographer Comments: Suboptimal apical window and no subcostal window. IMPRESSIONS  1. Left ventricular ejection fraction, by estimation, is 60 to 65%. The left ventricle has normal function. The left ventricle has no regional wall motion abnormalities. Left ventricular diastolic parameters are consistent with Grade I diastolic dysfunction (impaired relaxation).  2. Right ventricular systolic function is normal. The right ventricular size is normal.  3. The mitral valve is normal in structure. No evidence of mitral valve regurgitation. No evidence of mitral stenosis.  4. The aortic valve is normal in structure. Aortic valve regurgitation is not visualized. No aortic stenosis is present.  5. The inferior vena cava is normal in size with  greater than 50% respiratory variability, suggesting right atrial pressure of 3 mmHg. FINDINGS  Left Ventricle: Left ventricular ejection fraction, by estimation, is 60 to 65%. The left ventricle has normal function. The left ventricle has no regional wall motion abnormalities. The left ventricular internal cavity size was normal in size. There is  no left ventricular hypertrophy. Left ventricular diastolic parameters are consistent with Grade I diastolic dysfunction (impaired relaxation). Right Ventricle: The right ventricular size is normal. No increase in right ventricular wall thickness. Right ventricular systolic function is normal. Left Atrium: Left atrial size was normal in size. Right Atrium: Right atrial size was normal in size. Pericardium: There is no evidence of pericardial effusion. Mitral Valve: The mitral valve is normal in structure. Normal mobility of the mitral valve leaflets. Mild mitral annular calcification. No evidence of mitral valve regurgitation. No evidence of mitral valve stenosis. MV peak gradient, 4.0 mmHg. The mean mitral valve gradient is 2.0 mmHg. Tricuspid Valve: The tricuspid valve is normal in structure. Tricuspid valve regurgitation is not demonstrated. No evidence of tricuspid stenosis. Aortic Valve: The aortic valve is normal in structure. Aortic valve regurgitation is not visualized. No aortic stenosis is present. Aortic valve mean gradient measures 3.0 mmHg. Aortic valve peak gradient measures 4.9 mmHg. Aortic valve area, by VTI measures 2.92 cm. Pulmonic Valve: The pulmonic valve was normal in structure. Pulmonic valve regurgitation is not visualized. No evidence of pulmonic stenosis. Aorta: The aortic root is normal in size and structure. Venous: The inferior vena cava is normal in size with greater than 50% respiratory variability, suggesting right atrial pressure of 3 mmHg. IAS/Shunts: No atrial level shunt detected by color flow Doppler.  LEFT VENTRICLE PLAX 2D LVIDd:          5.12 cm  Diastology LVIDs:         3.35 cm  LV e' lateral:   5.66 cm/s LV PW:         1.08 cm  LV E/e' lateral: 11.6 LV IVS:  0.95 cm  LV e' medial:    6.85 cm/s LVOT diam:     2.20 cm  LV E/e' medial:  9.6 LV SV:         63 LV SV Index:   24 LVOT Area:     3.80 cm  LEFT ATRIUM           Index LA diam:      2.90 cm 1.13 cm/m LA Vol (A2C): 59.6 ml 23.22 ml/m  AORTIC VALVE                   PULMONIC VALVE AV Area (Vmax):    3.24 cm    PV Vmax:       1.10 m/s AV Area (Vmean):   3.12 cm    PV Vmean:      63.600 cm/s AV Area (VTI):     2.92 cm    PV VTI:        0.199 m AV Vmax:           111.00 cm/s PV Peak grad:  4.8 mmHg AV Vmean:          77.500 cm/s PV Mean grad:  2.0 mmHg AV VTI:            0.215 m AV Peak Grad:      4.9 mmHg AV Mean Grad:      3.0 mmHg LVOT Vmax:         94.60 cm/s LVOT Vmean:        63.600 cm/s LVOT VTI:          0.165 m LVOT/AV VTI ratio: 0.77  AORTA Ao Root diam: 3.80 cm MITRAL VALVE MV Area (PHT): 4.80 cm    SHUNTS MV Peak grad:  4.0 mmHg    Systemic VTI:  0.16 m MV Mean grad:  2.0 mmHg    Systemic Diam: 2.20 cm MV Vmax:       1.00 m/s MV Vmean:      63.6 cm/s MV Decel Time: 158 msec MV E velocity: 65.50 cm/s MV A velocity: 94.10 cm/s MV E/A ratio:  0.70 Neoma Laming MD Electronically signed by Neoma Laming MD Signature Date/Time: 08/05/2019/2:32:06 PM    Final         Scheduled Meds: . donepezil  10 mg Oral QHS  . enoxaparin (LOVENOX) injection  40 mg Subcutaneous Q12H  . levothyroxine  125 mcg Oral QAC breakfast  . memantine  10 mg Oral BID  . valACYclovir  1,000 mg Oral Daily   Continuous Infusions: . sodium chloride 100 mL/hr at 08/05/19 1710  . ampicillin-sulbactam (UNASYN) IV 3 g (08/06/19 1053)  . vancomycin 1,250 mg (08/05/19 2302)    Assessment & Plan:   Principal Problem:   Sepsis ([place]) Active Problems:   Dementia ([place])   Hypothyroidism    Sepsis ([facility])/lactic acidosis/pneumonia, concern for aspiration -Blood cultures x2pending Aspiration  precautions -IV Unasyn -Swallow evaluation- dysphagia 3 diet. See speech path note -Oxygen, nebulizers -Repeat lactic acid level improved with hydration  Acute metabolic encephalopathy with underlying dementia- likely 2/2 asp pna.   cellulitis groin/?shingles left chest wall -No report of diarrhea -continue  IV Vanco and p.o. valacyclovir Ck mrsa pcr-pending  Rhabdomyolysis -Gentle IV fluid hydration, will decrease ivf to 50ml/hr -Follow CKs improving, continue to monitor  Elevated troponin- mildly  , likely demand ischemia  -echo nml EF  Hypothyroidism -TSH mildly elevated. nml FT4.recheck level in 4 weeks after acute setting resolves  Dementia -  stable, home meds resumed  PT/OT   DVT prophylaxis: lovenox Code Status:full Family Communication: called wife but no answer Disposition Plan: back to previous home life Barrier: requiring iv abx,as cant tolerate po yet, needs snf.if mental status at baseline can be d/c to snf when snf available       LOS: 1 day   Time spent: 45 min with >50% coc    Lynn Ito, MD [facility] Pager [phone]  If 7PM-7AM, please contact night-coverage [email or web address] Password [facility] 08/06/2019, 12:25 PM Patient ID: Keziah Drotar, male   DOB: 08-26-1950, 69 y.o.   MRN: [id number]

## 2019-08-07 ENCOUNTER — Encounter: Payer: Self-pay | Admitting: Family Medicine

## 2019-08-07 LAB — BASIC METABOLIC PANEL
Anion gap: 6 (ref 5–15)
BUN: 12 mg/dL (ref 8–23)
CO2: 27 mmol/L (ref 22–32)
Calcium: 8 mg/dL — ABNORMAL LOW (ref 8.9–10.3)
Chloride: 106 mmol/L (ref 98–111)
Creatinine, Ser: 0.85 mg/dL (ref 0.61–1.24)
GFR calc Af Amer: >60 mL/min (ref >60)
GFR calc non Af Amer: >60 mL/min (ref >60)
Glucose, Bld: 105 mg/dL — ABNORMAL HIGH (ref 70–99)
Potassium: 3.3 mmol/L — ABNORMAL LOW (ref 3.5–5.1)
Sodium: 139 mmol/L (ref 135–145)

## 2019-08-07 LAB — CBC
HCT: 40.3 % (ref 39.0–52.0)
Hemoglobin: 13.1 g/dL (ref 13.0–17.0)
MCH: 29.4 pg (ref 26.0–34.0)
MCHC: 32.5 g/dL (ref 30.0–36.0)
MCV: 90.4 fL (ref 80.0–100.0)
Platelets: 197 10*3/uL (ref 150–400)
RBC: 4.46 MIL/uL (ref 4.22–5.81)
RDW: 12.6 % (ref 11.5–15.5)
WBC: 8 10*3/uL (ref 4.0–10.5)
nRBC: 0 % (ref 0.0–0.2)

## 2019-08-07 LAB — MAGNESIUM: Magnesium: 1.8 mg/dL (ref 1.7–2.4)

## 2019-08-07 LAB — CK: Total CK: 308 U/L (ref 49–397)

## 2019-08-07 MED ORDER — HALOPERIDOL LACTATE 5 MG/ML IJ SOLN
2.0000 mg | Freq: Once | INTRAMUSCULAR | Status: AC
  Administered 2019-08-08: 2 mg via INTRAVENOUS
  Filled 2019-08-07: qty 1

## 2019-08-07 MED ORDER — POTASSIUM CHLORIDE CRYS ER 20 MEQ PO TBCR
40.0000 meq | EXTENDED_RELEASE_TABLET | Freq: Once | ORAL | Status: AC
  Administered 2019-08-07: 40 meq via ORAL
  Filled 2019-08-07: qty 2

## 2019-08-07 NOTE — Progress Notes (Signed)
Progress Note    Parker Chambers  TKZ:[id number] DOB: Apr 25, 1950  DOA: 08/04/2019 PCP: Wilford Corner, PA-C      Brief Narrative:    Medical records reviewed and are as summarized below:  Parker Chambers is an 69 y.o. male with medical history significant for dementia, hypothyroidism.  He was referred from an independent living center to the emergency room because of generalized weakness and confusion.  Reportedly, he was laying in his own feces.  In the ER the patient was hypoxic with oxygen saturation in the upper 80s.  Work-up revealed sepsis secondary to pneumonia, probable aspiration pneumonia.       Assessment/Plan:   Principal Problem:   Sepsis (HCC) Active Problems:   Dementia (HCC)   Hypothyroidism  Sepsis (HCC)/lactic acidosis/pneumonia, concern for aspiration No growth on blood cultures thus far.  Continue IV Unasyn.  He is tolerating room air.  Dysphagia Aspiration precautions Speech therapist recommended dysphagia 3 diet.  Acute toxic metabolic encephalopathy with underlying dementia- Improving.  Continue supportive care.  shingles left chest wall Continue oral valacyclovir.  I do not see any evidence of groin cellulitis at this time.  Discontinue IV vancomycin.  MRSA screening was negative.  Rhabdomyolysis Improved.  CK level has normalized.  Discontinue IV fluids to avoid fluid overload.  Elevated troponin- mildly  , likely demand ischemia  2D echo showed EF estimated at 60 to 65% and grade 1 diastolic dysfunction.  Hypokalemia Replete potassium  Hypothyroidism -TSH mildly elevated. nml FT4.recheck level in 4 weeks after acute setting resolves  Dementia -stable, home meds resumed   Body mass index is 42.04 kg/m.  (Morbid obesity)   Family Communication/Anticipated D/C date and plan/Code Status   DVT prophylaxis: Lovenox Code Status: Full code Family Communication: Plan discussed with his wife at the bedside.  All her  questions were answered. Disposition Plan:    Status is: Inpatient  Remains inpatient appropriate because:Unsafe d/c plan, IV treatments appropriate due to intensity of illness or inability to take PO and Inpatient level of care appropriate due to severity of illness   Dispo: The patient is from: Home              Anticipated d/c is to: SNF              Anticipated d/c date is: 2 days              Patient currently is not medically stable to d/c.            Subjective:   Patient is confused and unable to provide any history.  Objective:    Vitals:   08/07/19 0000 08/07/19 0351 08/07/19 0448 08/07/19 0821  BP: (!) 153/70 (!) 151/87  (!) 119/91  Pulse:    66  Resp: 19 19    Temp: 99.9 F (37.7 C) 98.8 F (37.1 C) 99 F (37.2 C) 98.8 F (37.1 C)  TempSrc: Axillary Axillary Axillary Axillary  SpO2:    100%  Weight:      Height:       No data found.   Intake/Output Summary (Last 24 hours) at 08/07/2019 1529 Last data filed at 08/07/2019 1500 Gross per 24 hour  Intake 4910.56 ml  Output 651 ml  Net 4259.56 ml   Filed Weights   08/04/19 1944  Weight: (!) 140.6 kg    Exam:  GEN: NAD SKIN: Erythematous rash on left side of chest and abdomen.  No vesicles or crusted lesions  noted. EYES: EOMI ENT: MMM CV: RRR PULM: CTA B ABD: soft, ND, NT, +BS CNS: Alert and oriented to person only, non focal EXT: No edema or tenderness   Data Reviewed:   I have personally reviewed following labs and imaging studies:  Labs: Labs show the following:   Basic Metabolic Panel: Recent Labs  Lab 08/04/19 1949 08/04/19 1949 08/05/19 0023 08/05/19 0433 08/05/19 0433 08/06/19 0535 08/07/19 0634  NA 140  --   --  142  --  142 139  K 3.7   < >  --  4.1   < > 3.9 3.3*  CL 103  --   --  108  --  107 106  CO2 23  --   --  26  --  27 27  GLUCOSE 176*  --   --  114*  --  95 105*  BUN 21  --   --  18  --  15 12  CREATININE 1.26*  --  1.07 1.00  --  0.88 0.85    CALCIUM 9.1  --   --  8.2*  --  8.3* 8.0*   < > = values in this interval not displayed.   GFR Estimated Creatinine Clearance: 119.3 mL/min (by C-G formula based on SCr of 0.85 mg/dL). Liver Function Tests: Recent Labs  Lab 08/04/19 1949 08/05/19 0433  AST 58* 48*  ALT 23 21  ALKPHOS 70 54  BILITOT 1.3* 1.0  PROT 7.3 6.4*  ALBUMIN 4.1 3.4*   No results for input(s): LIPASE, AMYLASE in the last 168 hours. No results for input(s): AMMONIA in the last 168 hours. Coagulation profile No results for input(s): INR, PROTIME in the last 168 hours.  CBC: Recent Labs  Lab 08/04/19 1949 08/05/19 0023 08/05/19 0433 08/06/19 0535 08/07/19 0634  WBC 24.5* 19.1* 16.5* 11.8* 8.0  NEUTROABS 22.8*  --   --   --   --   HGB 17.5* 16.0 15.1 13.9 13.1  HCT 53.0* 47.2 47.2 42.7 40.3  MCV 89.5 88.2 92.2 92.4 90.4  PLT 292 232 210 212 197   Cardiac Enzymes: Recent Labs  Lab 08/04/19 1949 08/05/19 0433 08/06/19 0535 08/07/19 0634  CKTOTAL 2,609* 1,603* 692* 308   BNP (last 3 results) No results for input(s): PROBNP in the last 8760 hours. CBG: No results for input(s): GLUCAP in the last 168 hours. D-Dimer: No results for input(s): DDIMER in the last 72 hours. Hgb A1c: No results for input(s): HGBA1C in the last 72 hours. Lipid Profile: No results for input(s): CHOL, HDL, LDLCALC, TRIG, CHOLHDL, LDLDIRECT in the last 72 hours. Thyroid function studies: Recent Labs    08/05/19 0023  TSH 5.880*  T4TOTAL 6.1   Anemia work up: No results for input(s): VITAMINB12, FOLATE, FERRITIN, TIBC, IRON, RETICCTPCT in the last 72 hours. Sepsis Labs: Recent Labs  Lab 08/04/19 1949 08/04/19 1949 08/04/19 2135 08/05/19 0023 08/05/19 0227 08/05/19 0433 08/06/19 0535 08/07/19 0634  WBC 24.5*   < >  --  19.1*  --  16.5* 11.8* 8.0  LATICACIDVEN 3.3*  --  3.2*  --  1.4 1.3  --   --    < > = values in this interval not displayed.    Microbiology Recent Results (from the past 240  hour(s))  Blood culture (routine x 2)     Status: None (Preliminary result)   Collection Time: 08/04/19  7:49 PM   Specimen: BLOOD  Result Value Ref Range Status   Specimen  Description BLOOD BLOOD RIGHT HAND  Final   Special Requests   Final    BOTTLES DRAWN AEROBIC AND ANAEROBIC Blood Culture results may not be optimal due to an excessive volume of blood received in culture bottles   Culture   Final    NO GROWTH 3 DAYS Performed at [facility], [address] [place], [place] [address]    Report Status PENDING  Incomplete  Blood culture (routine x 2)     Status: None (Preliminary result)   Collection Time: 08/04/19  7:49 PM   Specimen: BLOOD  Result Value Ref Range Status   Specimen Description BLOOD BLOOD LEFT FOREARM  Final   Special Requests   Final    BOTTLES DRAWN AEROBIC AND ANAEROBIC Blood Culture adequate volume   Culture   Final    NO GROWTH 3 DAYS Performed at [facility], [address] [place], [place] [address]    Report Status PENDING  Incomplete  SARS Coronavirus 2 by RT PCR (hospital order, performed in [facility] hospital lab) Nasopharyngeal Nasopharyngeal Swab     Status: None   Collection Time: 08/04/19  7:49 PM   Specimen: Nasopharyngeal Swab  Result Value Ref Range Status   SARS Coronavirus 2 NEGATIVE NEGATIVE Final    Comment: (NOTE) SARS-CoV-2 target nucleic acids are NOT DETECTED. The SARS-CoV-2 RNA is generally detectable in upper and lower respiratory specimens during the acute phase of infection. The lowest concentration of SARS-CoV-2 viral copies this assay can detect is 250 copies / mL. A negative result does not preclude SARS-CoV-2 infection and should not be used as the sole basis for treatment or other patient management decisions.  A negative result may occur with improper specimen collection / handling, submission of specimen other than nasopharyngeal swab, presence of viral mutation(s) within the areas targeted by  this assay, and inadequate number of viral copies (<250 copies / mL). A negative result must be combined with clinical observations, patient history, and epidemiological information. Fact Sheet for Patients:   BoilerBrush.com.cy Fact Sheet for Healthcare Providers: https://pope.com/ This test is not yet approved or cleared  by the [place] FDA and has been authorized for detection and/or diagnosis of SARS-CoV-2 by FDA under an Emergency Use Authorization (EUA).  This EUA will remain in effect (meaning this test can be used) for the duration of the COVID-19 declaration under Section 564(b)(1) of the Act, 21 U.S.C. section 360bbb-3(b)(1), unless the authorization is terminated or revoked sooner. Performed at [facility], [address] [place], [place] [address]   Urine Culture     Status: None   Collection Time: 08/04/19  7:49 PM   Specimen: Urine, Random  Result Value Ref Range Status   Specimen Description   Final    URINE, RANDOM Performed at [facility], [address] [place], [place] [address]    Special Requests   Final    NONE Performed at [facility], [address] [place], [place] [address]    Culture   Final    NO GROWTH Performed at [facility], [address] [place], [place] [address]    Report Status 08/05/2019 FINAL  Final  MRSA PCR Screening     Status: None   Collection Time: 08/06/19  2:22 PM   Specimen: Nasopharyngeal  Result Value Ref Range Status   MRSA by PCR NEGATIVE NEGATIVE Final    Comment:        The GeneXpert MRSA Assay (  FDA approved for NASAL specimens only), is one component of a comprehensive MRSA colonization surveillance program. It is not intended to diagnose MRSA infection nor to guide or monitor treatment for MRSA infections. Performed at [facility], [address] [place], [place] [address]     Procedures and diagnostic  studies:  No results found.  Medications:   . donepezil  10 mg Oral QHS  . enoxaparin (LOVENOX) injection  40 mg Subcutaneous Q12H  . levothyroxine  125 mcg Oral QAC breakfast  . memantine  10 mg Oral BID  . potassium chloride  40 mEq Oral Once  . valACYclovir  1,000 mg Oral Daily   Continuous Infusions: . ampicillin-sulbactam (UNASYN) IV 3 g (08/07/19 1101)     LOS: 2 days   Toleen Lachapelle  [facility]     08/07/2019, 3:29 PM

## 2019-08-07 NOTE — NC FL2 (Signed)
[place] MEDICAID FL2 LEVEL OF CARE SCREENING TOOL     IDENTIFICATION  Patient Name: Parker Chambers Birthdate: Jun 17, 1950 Sex: male Admission Date (Current Location): 08/04/2019  [place] and [place] Number:  Chiropodist and Address:  [facility], [address] [place], [place] [address]      Provider Number: [id number]  Attending Physician Name and Address:  Lurene Shadow, MD  Relative Name and Phone Number:  Wilber Fini  [phone]    Current Level of Care: Hospital Recommended Level of Care: Skilled Nursing Facility Prior Approval Number:    Date Approved/Denied:   PASRR Number: [id number]  Discharge Plan: SNF    Current Diagnoses: Patient Active Problem List   Diagnosis Date Noted  . Sepsis (HCC) 08/04/2019  . Dementia (HCC) 08/04/2019  . Hypothyroidism 08/04/2019  . Altered mental status   . Aspiration pneumonia (HCC)   . Non-traumatic rhabdomyolysis     Orientation RESPIRATION BLADDER Height & Weight     Self  Normal Incontinent Weight: (!) 310 lb (140.6 kg) Height:  6' (182.9 cm)  BEHAVIORAL SYMPTOMS/MOOD NEUROLOGICAL BOWEL NUTRITION STATUS      Continent Diet(Dys 3 thin liquids)  AMBULATORY STATUS COMMUNICATION OF NEEDS Skin   Extensive Assist Verbally Normal                       Personal Care Assistance Level of Assistance  Bathing, Dressing Bathing Assistance: Limited assistance   Dressing Assistance: Limited assistance     Functional Limitations Info             SPECIAL CARE FACTORS FREQUENCY  PT (By licensed PT), OT (By licensed OT)     PT Frequency: 5x week OT Frequency: 5x week            Contractures Contractures Info: Not present    Additional Factors Info  Code Status, Allergies, Isolation Precautions Code Status Info: Full code Allergies Info: No known allergies     Isolation Precautions Info: Shingles     Current Medications (08/07/2019):  This is the current  hospital active medication list Current Facility-Administered Medications  Medication Dose Route Frequency Provider Last Rate Last Admin  . 0.9 %  sodium chloride infusion   Intravenous Continuous Lynn Ito, MD   Stopped at 08/07/19 0439  . acetaminophen (TYLENOL) tablet 650 mg  650 mg Oral Q6H PRN Gery Pray, MD   650 mg at 08/07/19 0933   Or  . acetaminophen (TYLENOL) suppository 650 mg  650 mg Rectal Q6H PRN Crosley, Debby, MD      . Ampicillin-Sulbactam (UNASYN) 3 g in sodium chloride 0.9 % 100 mL IVPB  3 g Intravenous Q6H Lurene Shadow, MD 200 mL/hr at 08/07/19 1101 3 g at 08/07/19 1101  . donepezil (ARICEPT) tablet 10 mg  10 mg Oral QHS Crosley, Debby, MD   10 mg at 08/06/19 2146  . enoxaparin (LOVENOX) injection 40 mg  40 mg Subcutaneous Q12H Crosley, Debby, MD   40 mg at 08/07/19 0940  . levothyroxine (SYNTHROID) tablet 125 mcg  125 mcg Oral QAC breakfast Gery Pray, MD   125 mcg at 08/07/19 0931  . memantine (NAMENDA) tablet 10 mg  10 mg Oral BID Gery Pray, MD   10 mg at 08/07/19 0932  . ondansetron (ZOFRAN) tablet 4 mg  4 mg Oral Q6H PRN Crosley, Debby, MD       Or  . ondansetron (ZOFRAN) injection 4 mg  4 mg Intravenous Q6H  PRN Quintella Baton, MD      . valACYclovir (VALTREX) tablet 1,000 mg  1,000 mg Oral Daily Quintella Baton, MD   1,000 mg at 08/07/19 0932     Discharge Medications: Please see discharge summary for a list of discharge medications.  Relevant Imaging Results:  Relevant Lab Results:   Additional Information SSN: [id number]  Alyah Boehning, Gardiner Rhyme, LCSW

## 2019-08-07 NOTE — TOC Progression Note (Signed)
Transition of Care [facility]) - Progression Note    Patient Details  Name: Parker Chambers MRN: [id number] Date of Birth: 1950-05-19  Transition of [facility]) CM/SW Contact  Parker Chambers, Parker Livings, LCSW Phone Number: 08/07/2019, 1:57 PM  Clinical Narrative: Left message for wife to discuss discharge plans for husband. Therapist recommend SNF and have contacted Parker Chambers regarding possible plan for rehab before returning back to apartment with wife. Await return call          Expected Discharge Plan and Services                                                 Social Determinants of Health (SDOH) Interventions    Readmission Risk Interventions No flowsheet data found.

## 2019-08-07 NOTE — Evaluation (Signed)
Physical Therapy Evaluation Patient Details Name: Parker Chambers MRN: [id number] DOB: 10-Dec-1950 Today's Date: 08/07/2019   History of Present Illness  Pt is a 69 y.o. male presenting to hospital 5/16 with AMS, weakness, and covered in feces.  Pt admitted with sepsis, lactic acidosis/PNA, concern for aspiration, cellulitis groin/?shingles L chest wall, rhabdomyolysis, and elevated troponin.   DG pelvis showing questionable lucency within the greater trochanter of R femur; CT of pelvis shows no visible R femoral or pelvic fx (lucency on comparison radiograph may have been a projection anomaly).  PMH includes dementia, thyroid disease, hemorrhoid surgery.  Clinical Impression  Pt's wife just leaving upon therapist preparing to see pt and reports pt was ambulatory with walker prior to recent decline in status.  Pt resting in bed with only sheets on so therapist placed gown and socks on pt for sessions activities.  Pt appearing confused and often very difficult to understand pt's words/sentences (unless pt stating 1 word answers).  Impulsivity, confusion, and difficulty following 1 step commands noted during sessions activities.  Increased time required during session for pt to process information and participate/initiate movement for activities.  Currently pt is mod to max assist semi-supine to sitting edge of bed; unable to stand up to RW (although therapist attempted multiple times to stand pt (up to RW) but pt either did not initiate to assist (after pt saying yes that he wanted to stand) or would say "no" to standing.  Pt assisted back to bed end of session and repositioned in bed for comfort.   Pt would benefit from skilled PT to address noted impairments and functional limitations (see below for any additional details).  Upon hospital discharge, pt would benefit from STR.    Follow Up Recommendations SNF    Equipment Recommendations  Rolling walker with 5" wheels;3in1 (PT);Wheelchair (measurements  PT);Wheelchair cushion (measurements PT)    Recommendations for Other Services OT consult     Precautions / Restrictions Precautions Precautions: Fall Precaution Comments: Aspiration precautions Restrictions Weight Bearing Restrictions: No      Mobility  Bed Mobility Overal bed mobility: Needs Assistance Bed Mobility: Supine to Sit;Sit to Supine     Supine to sit: Mod assist;Max assist;HOB elevated(x2 trials) Sit to supine: Max assist;HOB elevated(2 assist to boost pt up in bed end of session using bed sheet)   General bed mobility comments: vc's and tactile cues for technique; increased effort and time to perform  Transfers Overall transfer level: Needs assistance Equipment used: Rolling walker (2 wheeled)             General transfer comment: attempted multiple times to stand pt (up to RW) but pt either did not initiate to assist (after pt saying yes that he wanted to stand) or would say "no" to standing  Ambulation/Gait                Stairs            Wheelchair Mobility    Modified Rankin (Stroke Patients Only)       Balance Overall balance assessment: Needs assistance Sitting-balance support: No upper extremity supported;Feet supported Sitting balance-Leahy Scale: Good Sitting balance - Comments: steady sitting reaching within BOS                                     Pertinent Vitals/Pain Pain Assessment: Faces Faces Pain Scale: No hurt Pain Intervention(s): Limited activity within patient's  tolerance;Monitored during session;Repositioned  HR stable and WFL throughout treatment session.    Home Living Family/patient expects to be discharged to:: Assisted living                 Additional Comments: Per chart review pt and pt's wife live at [facility]; have 24 hour caregivers.    Prior Function Level of Independence: Needs assistance   Gait / Transfers Assistance Needed: Pt's wife reports he was using a  RW or 4ww for ambulation  ADL's / Homemaking Assistance Needed: Per OT note: "Pt has caregivers to help with showers, dressing skills and was able to feed himself and participate in basic self care only per wife's report."        Hand Dominance        Extremity/Trunk Assessment   Upper Extremity Assessment Upper Extremity Assessment: Generalized weakness;Difficult to assess due to impaired cognition    Lower Extremity Assessment Lower Extremity Assessment: Generalized weakness;Difficult to assess due to impaired cognition    Cervical / Trunk Assessment Cervical / Trunk Assessment: (forward head/shoulders)  Communication   Communication: Expressive difficulties  Cognition Arousal/Alertness: Awake/alert Behavior During Therapy: Restless;Flat affect;Impulsive Overall Cognitive Status: No family/caregiver present to determine baseline cognitive functioning                                 General Comments: Per OT note: "Pt with hx of advanced dementia for 18 years and recently moved from [place] with his wife."      General Comments   Nursing cleared pt for participation in physical therapy.  Pt agreeable to PT session.    Exercises     Assessment/Plan    PT Assessment Patient needs continued PT services  PT Problem List Decreased strength;Decreased activity tolerance;Decreased balance;Decreased mobility;Decreased knowledge of use of DME;Decreased cognition;Decreased safety awareness;Decreased knowledge of precautions;Decreased skin integrity       PT Treatment Interventions DME instruction;Gait training;Functional mobility training;Therapeutic activities;Therapeutic exercise;Balance training;Patient/family education    PT Goals (Current goals can be found in the Care Plan section)  Acute Rehab PT Goals Patient Stated Goal: to improve mobility back to PLOF PT Goal Formulation: With patient Time For Goal Achievement: 08/21/19 Potential to Achieve Goals:  Fair    Frequency Min 2X/week   Barriers to discharge Decreased caregiver support      Co-evaluation               AM-PAC PT "6 Clicks" Mobility  Outcome Measure Help needed turning from your back to your side while in a flat bed without using bedrails?: A Lot Help needed moving from lying on your back to sitting on the side of a flat bed without using bedrails?: A Lot Help needed moving to and from a bed to a chair (including a wheelchair)?: Total Help needed standing up from a chair using your arms (e.g., wheelchair or bedside chair)?: Total Help needed to walk in hospital room?: Total Help needed climbing 3-5 steps with a railing? : Total 6 Click Score: 8    End of Session Equipment Utilized During Treatment: Gait belt Activity Tolerance: Patient tolerated treatment well Patient left: in bed;with call bell/phone within reach;with bed alarm set;with nursing/sitter in room Nurse Communication: Mobility status;Precautions PT Visit Diagnosis: Other abnormalities of gait and mobility (R26.89);Muscle weakness (generalized) (M62.81);History of falling (Z91.81);Difficulty in walking, not elsewhere classified (R26.2)    Time: 1950-9326 PT Time Calculation (min) (ACUTE ONLY):  60 min   Charges:   PT Evaluation $PT Eval Low Complexity: 1 Low PT Treatments $Therapeutic Activity: 38-52 mins       Hendricks Limes, PT 08/07/19, 1:04 PM

## 2019-08-08 ENCOUNTER — Encounter
Admission: RE | Admit: 2019-08-08 | Discharge: 2019-08-08 | Disposition: A | Discharge status: Home / Self Care | Payer: Self-pay | Source: Ambulatory Visit | Attending: Internal Medicine | Admitting: Internal Medicine

## 2019-08-08 LAB — POTASSIUM: Potassium: 3.4 mmol/L — ABNORMAL LOW (ref 3.5–5.1)

## 2019-08-08 LAB — SARS CORONAVIRUS 2 BY RT PCR (HOSPITAL ORDER, PERFORMED IN ~~LOC~~ HOSPITAL LAB): SARS Coronavirus 2: NEGATIVE

## 2019-08-08 MED ORDER — AMOXICILLIN-POT CLAVULANATE 875-125 MG PO TABS
1.0000 | ORAL_TABLET | Freq: Two times a day (BID) | ORAL | Status: DC
  Administered 2019-08-08 – 2019-08-09 (×2): 1 via ORAL
  Filled 2019-08-08 (×2): qty 1

## 2019-08-08 MED ORDER — POTASSIUM CHLORIDE CRYS ER 20 MEQ PO TBCR
40.0000 meq | EXTENDED_RELEASE_TABLET | Freq: Once | ORAL | Status: AC
  Administered 2019-08-08: 40 meq via ORAL
  Filled 2019-08-08: qty 2

## 2019-08-08 NOTE — Progress Notes (Signed)
SLP F/U Note  Patient Details Name: Parker Chambers MRN: [id number] DOB: May 11, 1950   Cancelled treatment:       Reason Eval/Treat Not Completed: (chart reviewed; consulted NSG re: pt's status). Per NSG report, pt ate well at breakfast meal this morning; some snacks w/ Wife in room. NSG denied any overt s/s of aspiration w/ oral intake. Due to Baseline Cognitive decline/Dementia, pt requires feeding support at meals which NSG provides at meals.  Recommend continued feeding support at meals as needed; general REFLUX and aspiration precautions w/ current diet, thin liquids. Recommend Pills in Puree for safer swallowing if indicated.  NSG to reconsult ST services if any new issues arise while admitted.     Jerilynn Som, MS, CCC-SLP Marlan Steward 08/08/2019, 6:11 PM

## 2019-08-08 NOTE — Progress Notes (Addendum)
Progress Note    Parker Chambers  KGM:[id number] DOB: 09/06/1950  DOA: 08/04/2019 PCP: Wilford Corner, PA-C      Brief Narrative:    Medical records reviewed and are as summarized below:  Parker Chambers is an 69 y.o. male with medical history significant for dementia, hypothyroidism.  He was referred from an independent living center to the emergency room because of generalized weakness and confusion.  Reportedly, he was laying in his own feces.  In the ER the patient was hypoxic with oxygen saturation in the upper 80s.  Work-up revealed sepsis secondary to pneumonia, probable aspiration pneumonia. He was also found to have lesions suspicious for shingles.  He was treated with IV fluids, empiric IV antibiotics and oral valacyclovir.  He had rhabdomyolysis and acute kidney injury but these this resolved with IV fluids.  Overall, his condition has improved.  He was evaluated by PT and OT who recommended further rehabilitation at the skilled nursing facility.     Assessment/Plan:   Principal Problem:   Sepsis (HCC) Active Problems:   Dementia (HCC)   Hypothyroidism   Aspiration pneumonia (HCC)  Sepsis (HCC)/lactic acidosis/pneumonia, concern for aspiration No growth on blood cultures thus far.  Change IV Unasyn to Augmentin.  He is tolerating room air.  Dysphagia Aspiration precautions Speech therapist recommended dysphagia 3 diet.  Acute toxic metabolic encephalopathy with underlying dementia- Improved.  He appears back to baseline.  Continue supportive care.  shingles left chest wall Continue oral valacyclovir.  I do not see any evidence of groin cellulitis at this time.  Marland Kitchen  MRSA screening was negative.  Rhabdomyolysis Improved.  CK level has normalized.    Elevated troponin- mildly  , likely demand ischemia  2D echo showed EF estimated at 60 to 65% and grade 1 diastolic dysfunction.  Hypokalemia Replete potassium and monitor  level  Hypothyroidism -TSH mildly elevated. nml FT4.recheck level in 4 weeks after acute setting resolves  Dementia -stable, home meds resumed   Body mass index is 42.04 kg/m.  (Morbid obesity)   Family Communication/Anticipated D/C date and plan/Code Status   DVT prophylaxis: Lovenox Code Status: Full code Family Communication: Plan discussed with his wife at the bedside.  All her questions were answered. Disposition Plan:    Status is: Inpatient  Remains inpatient appropriate because:Unsafe d/c plan, IV treatments appropriate due to intensity of illness or inability to take PO and Inpatient level of care appropriate due to severity of illness   Dispo: The patient is from: Home              Anticipated d/c is to: SNF              Anticipated d/c date is: 1 day              Patient currently is not medically stable to d/c.            Subjective:   Patient is confused and unable to provide any history.  Objective:    Vitals:   08/07/19 0448 08/07/19 0821 08/07/19 2350 08/08/19 0755  BP:  (!) 119/91 (!) 160/109 (!) 147/84  Pulse:  66 78 64  Resp:      Temp: 99 F (37.2 C) 98.8 F (37.1 C)  (!) 97.5 F (36.4 C)  TempSrc: Axillary Axillary  Oral  SpO2:  100%  95%  Weight:      Height:       No data found.   Intake/Output  Summary (Last 24 hours) at 08/08/2019 1252 Last data filed at 08/07/2019 1500 Gross per 24 hour  Intake 100 ml  Output --  Net 100 ml   Filed Weights   08/04/19 1944  Weight: (!) 140.6 kg    Exam:  GEN: NAD SKIN: Erythematous area with eroded skin on left side of chest.  No vesicles or crusted lesions noted. EYES: EOMI ENT: MMM CV: RRR PULM: CTA B ABD: soft, ND, NT, +BS CNS: Alert and oriented to person only, non focal EXT: No edema or tenderness   Data Reviewed:   I have personally reviewed following labs and imaging studies:  Labs: Labs show the following:   Basic Metabolic Panel: Recent Labs  Lab  08/04/19 1949 08/04/19 1949 08/05/19 0023 08/05/19 0433 08/05/19 0433 08/06/19 0535 08/06/19 0535 08/07/19 0634 08/07/19 1553 08/08/19 0452  NA 140  --   --  142  --  142  --  139  --   --   K 3.7   < >  --  4.1   < > 3.9   < > 3.3*  --  3.4*  CL 103  --   --  108  --  107  --  106  --   --   CO2 23  --   --  26  --  27  --  27  --   --   GLUCOSE 176*  --   --  114*  --  95  --  105*  --   --   BUN 21  --   --  18  --  15  --  12  --   --   CREATININE 1.26*  --  1.07 1.00  --  0.88  --  0.85  --   --   CALCIUM 9.1  --   --  8.2*  --  8.3*  --  8.0*  --   --   MG  --   --   --   --   --   --   --   --  1.8  --    < > = values in this interval not displayed.   GFR Estimated Creatinine Clearance: 119.3 mL/min (by C-G formula based on SCr of 0.85 mg/dL). Liver Function Tests: Recent Labs  Lab 08/04/19 1949 08/05/19 0433  AST 58* 48*  ALT 23 21  ALKPHOS 70 54  BILITOT 1.3* 1.0  PROT 7.3 6.4*  ALBUMIN 4.1 3.4*   No results for input(s): LIPASE, AMYLASE in the last 168 hours. No results for input(s): AMMONIA in the last 168 hours. Coagulation profile No results for input(s): INR, PROTIME in the last 168 hours.  CBC: Recent Labs  Lab 08/04/19 1949 08/05/19 0023 08/05/19 0433 08/06/19 0535 08/07/19 0634  WBC 24.5* 19.1* 16.5* 11.8* 8.0  NEUTROABS 22.8*  --   --   --   --   HGB 17.5* 16.0 15.1 13.9 13.1  HCT 53.0* 47.2 47.2 42.7 40.3  MCV 89.5 88.2 92.2 92.4 90.4  PLT 292 232 210 212 197   Cardiac Enzymes: Recent Labs  Lab 08/04/19 1949 08/05/19 0433 08/06/19 0535 08/07/19 0634  CKTOTAL 2,609* 1,603* 692* 308   BNP (last 3 results) No results for input(s): PROBNP in the last 8760 hours. CBG: No results for input(s): GLUCAP in the last 168 hours. D-Dimer: No results for input(s): DDIMER in the last 72 hours. Hgb A1c: No results for input(s): HGBA1C in the  last 72 hours. Lipid Profile: No results for input(s): CHOL, HDL, LDLCALC, TRIG, CHOLHDL, LDLDIRECT  in the last 72 hours. Thyroid function studies: No results for input(s): TSH, T4TOTAL, T3FREE, THYROIDAB in the last 72 hours.  Invalid input(s): FREET3 Anemia work up: No results for input(s): VITAMINB12, FOLATE, FERRITIN, TIBC, IRON, RETICCTPCT in the last 72 hours. Sepsis Labs: Recent Labs  Lab 08/04/19 1949 08/04/19 1949 08/04/19 2135 08/05/19 0023 08/05/19 0227 08/05/19 0433 08/06/19 0535 08/07/19 0634  WBC 24.5*   < >  --  19.1*  --  16.5* 11.8* 8.0  LATICACIDVEN 3.3*  --  3.2*  --  1.4 1.3  --   --    < > = values in this interval not displayed.    Microbiology Recent Results (from the past 240 hour(s))  Blood culture (routine x 2)     Status: None (Preliminary result)   Collection Time: 08/04/19  7:49 PM   Specimen: BLOOD  Result Value Ref Range Status   Specimen Description BLOOD BLOOD RIGHT HAND  Final   Special Requests   Final    BOTTLES DRAWN AEROBIC AND ANAEROBIC Blood Culture results may not be optimal due to an excessive volume of blood received in culture bottles   Culture   Final    NO GROWTH 4 DAYS Performed at [facility], [address], [place], [place] [address]    Report Status PENDING  Incomplete  Blood culture (routine x 2)     Status: None (Preliminary result)   Collection Time: 08/04/19  7:49 PM   Specimen: BLOOD  Result Value Ref Range Status   Specimen Description BLOOD BLOOD LEFT FOREARM  Final   Special Requests   Final    BOTTLES DRAWN AEROBIC AND ANAEROBIC Blood Culture adequate volume   Culture   Final    NO GROWTH 4 DAYS Performed at [facility], [address] [place], [place] [address]    Report Status PENDING  Incomplete  SARS Coronavirus 2 by RT PCR (hospital order, performed in [facility] hospital lab) Nasopharyngeal Nasopharyngeal Swab     Status: None   Collection Time: 08/04/19  7:49 PM   Specimen: Nasopharyngeal Swab  Result Value Ref Range Status   SARS Coronavirus 2 NEGATIVE NEGATIVE Final     Comment: (NOTE) SARS-CoV-2 target nucleic acids are NOT DETECTED. The SARS-CoV-2 RNA is generally detectable in upper and lower respiratory specimens during the acute phase of infection. The lowest concentration of SARS-CoV-2 viral copies this assay can detect is 250 copies / mL. A negative result does not preclude SARS-CoV-2 infection and should not be used as the sole basis for treatment or other patient management decisions.  A negative result may occur with improper specimen collection / handling, submission of specimen other than nasopharyngeal swab, presence of viral mutation(s) within the areas targeted by this assay, and inadequate number of viral copies (<250 copies / mL). A negative result must be combined with clinical observations, patient history, and epidemiological information. Fact Sheet for Patients:   BoilerBrush.com.cy Fact Sheet for Healthcare Providers: https://pope.com/ This test is not yet approved or cleared  by the [place] FDA and has been authorized for detection and/or diagnosis of SARS-CoV-2 by FDA under an Emergency Use Authorization (EUA).  This EUA will remain in effect (meaning this test can be used) for the duration of the COVID-19 declaration under Section 564(b)(1) of the Act, 21 U.S.C. section 360bbb-3(b)(1), unless the authorization is terminated or revoked sooner. Performed  at [facility], [address], [place], [place] [address]   Urine Culture     Status: None   Collection Time: 08/04/19  7:49 PM   Specimen: Urine, Random  Result Value Ref Range Status   Specimen Description   Final    URINE, RANDOM Performed at [facility], [address], [place], [place] [address]    Special Requests   Final    NONE Performed at [facility], [address], [place], [place] [address]    Culture   Final    NO GROWTH Performed at [facility], [address], [place], [place] [address]    Report Status 08/05/2019 FINAL  Final  MRSA PCR Screening     Status: None   Collection Time: 08/06/19  2:22 PM   Specimen: Nasopharyngeal  Result Value Ref Range Status   MRSA by PCR NEGATIVE NEGATIVE Final    Comment:        The GeneXpert MRSA Assay (FDA approved for NASAL specimens only), is one component of a comprehensive MRSA colonization surveillance program. It is not intended to diagnose MRSA infection nor to guide or monitor treatment for MRSA infections. Performed at [facility], [address] [place], [place] [address]     Procedures and diagnostic studies:  No results found.  Medications:   . amoxicillin-clavulanate  1 tablet Oral Q12H  . donepezil  10 mg Oral QHS  . enoxaparin (LOVENOX) injection  40 mg Subcutaneous Q12H  . levothyroxine  125 mcg Oral QAC breakfast  . memantine  10 mg Oral BID  . valACYclovir  1,000 mg Oral Daily   Continuous Infusions:    LOS: 3 days   Angelito Hopping  [facility]     08/08/2019, 12:52 PM

## 2019-08-08 NOTE — TOC Progression Note (Addendum)
Transition of Care [facility]) - Progression Note    Patient Details  Name: Parker Chambers MRN: [id number] Date of Birth: [birth year]  Transition of Care [facility]) CM/SW Contact  Orville Mena, Gardiner Rhyme, LCSW Phone Number: 08/08/2019, 12:10 PM  Clinical Narrative:   Have left message for wife to discuss pt's readiness to go to rehab at [facility] tomorrow. Have spoken with Kimberly-Adm and she will need for him to have a new COVID test, has asked MD to order. Bedside RN aware of this. Work toward [employer] manager.  1:50 PM Met with pt's wife who was here and discussed plan for tomorrow. She is on board, but has a caregiver who may be able to assist at the facility also. Have given her Kimberly's number to call to ask her questions. Joelene Millin aware wife is going to call her.       Expected Discharge Plan and Services                                                 Social Determinants of Health (SDOH) Interventions    Readmission Risk Interventions No flowsheet data found.

## 2019-08-08 NOTE — Care Management Important Message (Signed)
Important Message  Patient Details  Name: Jocob Dambach MRN: [id number] Date of Birth: 1950-09-16   Medicare Important Message Given:  Yes  Discussed with wife via telephone due to pt is in a contact room   Edrees Valent, Lemar Livings, LCSW 08/08/2019, 10:35 AM

## 2019-08-09 DIAGNOSIS — B029 Zoster without complications: Secondary | ICD-10-CM

## 2019-08-09 LAB — CULTURE, BLOOD (ROUTINE X 2)
Culture: NO GROWTH
Culture: NO GROWTH
Special Requests: ADEQUATE

## 2019-08-09 MED ORDER — HYDRALAZINE HCL 20 MG/ML IJ SOLN
10.0000 mg | Freq: Four times a day (QID) | INTRAMUSCULAR | Status: DC | PRN
  Administered 2019-08-09: 10 mg via INTRAVENOUS
  Filled 2019-08-09 (×2): qty 0.5

## 2019-08-09 MED ORDER — PANTOPRAZOLE SODIUM 40 MG PO TBEC
40.0000 mg | DELAYED_RELEASE_TABLET | Freq: Every day | ORAL | Status: DC
  Administered 2019-08-09: 40 mg via ORAL
  Filled 2019-08-09: qty 1

## 2019-08-09 MED ORDER — VALACYCLOVIR HCL 500 MG PO TABS
1000.0000 mg | ORAL_TABLET | Freq: Three times a day (TID) | ORAL | Status: DC
  Administered 2019-08-09: 1000 mg via ORAL
  Filled 2019-08-09 (×3): qty 2

## 2019-08-09 MED ORDER — FOLIC ACID 1 MG PO TABS
1.0000 mg | ORAL_TABLET | Freq: Every day | ORAL | Status: DC
  Administered 2019-08-09: 1 mg via ORAL
  Filled 2019-08-09: qty 1

## 2019-08-09 MED ORDER — MIRTAZAPINE 15 MG PO TABS: 30.0000 mg | ORAL_TABLET | Freq: Every day | ORAL | Status: DC

## 2019-08-09 MED ORDER — LORAZEPAM 0.5 MG PO TABS: 0.5000 mg | ORAL_TABLET | Freq: Two times a day (BID) | ORAL | Status: DC | PRN

## 2019-08-09 MED ORDER — LORAZEPAM 0.5 MG PO TABS: 0.5000 mg | ORAL_TABLET | Freq: Two times a day (BID) | ORAL | 0 refills | Status: DC | PRN

## 2019-08-09 MED ORDER — VALACYCLOVIR HCL 1 G PO TABS: 1000.0000 mg | ORAL_TABLET | Freq: Three times a day (TID) | ORAL | 0 refills | Status: AC

## 2019-08-09 MED ORDER — QUETIAPINE FUMARATE 25 MG PO TABS: 25.0000 mg | ORAL_TABLET | Freq: Every day | ORAL | Status: DC

## 2019-08-09 MED ORDER — AMOXICILLIN-POT CLAVULANATE 875-125 MG PO TABS: 1.0000 | ORAL_TABLET | Freq: Two times a day (BID) | ORAL | 0 refills | Status: AC

## 2019-08-09 MED ORDER — AMLODIPINE BESYLATE 10 MG PO TABS
10.0000 mg | ORAL_TABLET | Freq: Every day | ORAL | Status: DC
  Administered 2019-08-09: 10 mg via ORAL
  Filled 2019-08-09: qty 1

## 2019-08-09 MED ORDER — QUETIAPINE FUMARATE 25 MG PO TABS: 100.0000 mg | ORAL_TABLET | Freq: Every day | ORAL | Status: DC

## 2019-08-09 MED ORDER — AMLODIPINE BESYLATE 10 MG PO TABS: 10.0000 mg | ORAL_TABLET | Freq: Every day | ORAL | 1 refills | Status: DC

## 2019-08-09 MED ORDER — VITAMIN D3 25 MCG (1000 UNIT) PO TABS
1000.0000 [IU] | ORAL_TABLET | Freq: Every day | ORAL | Status: DC
  Administered 2019-08-09: 1000 [IU] via ORAL
  Filled 2019-08-09: qty 1

## 2019-08-09 NOTE — Progress Notes (Signed)
   08/09/19 1059  Assess: MEWS Score  Temp 98.4 F (36.9 C)  BP (!) 158/78  Pulse Rate 99  Resp 18  SpO2 95 %  Assess: MEWS Score  MEWS Temp 0  MEWS Systolic 0  MEWS Pulse 0  MEWS RR 1  MEWS LOC 0  MEWS Score 1  MEWS Score Color [place]

## 2019-08-09 NOTE — Progress Notes (Signed)
Focused assessment    08/09/19 1030  Charting Type  Charting Type Shift assessment  Orders Chart Check (once per shift) Completed  Respiratory  Respiratory (WDL) WDL  Cardiac  Cardiac (WDL) WDL  Neurological  Level of Consciousness Alert

## 2019-08-09 NOTE — TOC Transition Note (Signed)
Transition of Care [facility]) - CM/SW Discharge Note   Patient Details  Name: Wil Slape MRN: [id number] Date of Birth: [birth year]  Transition of Care [facility]) CM/SW Contact:  Lucy Chris, LCSW Phone Number: 08/09/2019, 10:05 AM   Clinical Narrative: Pt medically stable to go to [facility] rehab today. Going to room 334. Bedside RN to call report to [phone]. Wife is here and aware of this. She will have their sitter provide assist with pt while in rehab. DC packet in chart and will call EMS once RN ready for transfer.      Final next level of care: Skilled Nursing Facility Barriers to Discharge: Barriers Resolved   Patient Goals and CMS Choice     Choice offered to / list presented to : Spouse  Discharge Placement PASRR number recieved: 08/07/19            Patient chooses bed at: [facility] Patient to be transferred to facility by: EMS Name of family member notified: [place]-wife Patient and family notified of of transfer: 08/09/19  Discharge Plan and Services                                     Social Determinants of Health (SDOH) Interventions     Readmission Risk Interventions No flowsheet data found.

## 2019-08-09 NOTE — Progress Notes (Signed)
Report called to [place]. LPN . Hard rx in discharge packet .

## 2019-08-09 NOTE — Progress Notes (Signed)
Patients VSS , report given to EMS , he is being transported by stretcher to [facility] IV removed from [facility]

## 2019-08-09 NOTE — Progress Notes (Signed)
   08/09/19 1032  Assess: MEWS Score  BP (!) 142/78  Pulse Rate (!) 102  Assess: MEWS Score  MEWS Temp 0  MEWS Systolic 0  MEWS Pulse 1  MEWS RR 1  MEWS LOC 0  MEWS Score 2  MEWS Score Color Yellow  Assess: if the MEWS score is Yellow or Red  Were vital signs taken at a resting state? Yes  Treat  MEWS Interventions Other (Comment) (Vs reassessed MEWS back to green)  Escalate  MEWS: Escalate Yellow: discuss with charge nurse/RN and consider discussing with provider and RRT  Notify: Provider  Provider Name/Title Dr, Allena Katz  Date Provider Notified 08/09/19  Time Provider Notified 1110  Notification Type Face-to-face  Notification Reason Other (Comment)  Response No new orders  Date of Provider Response 08/09/19  Time of Provider Response 1110  Document  Patient Outcome Other (Comment) (rechecked VS )

## 2019-08-09 NOTE — Discharge Summary (Signed)
[facility] [place] at [facility]   PATIENT NAME: Parker Chambers    MR#:  [id number]  DATE OF BIRTH:  Mar 05, 1951  DATE OF ADMISSION:  08/04/2019 ADMITTING PHYSICIAN: Gery Pray, MD  DATE OF DISCHARGE: 08/09/2019  PRIMARY CARE PHYSICIAN: Wilford Corner, PA-C    ADMISSION DIAGNOSIS:  Sepsis (HCC) [A41.9] Altered mental status, unspecified altered mental status type [R41.82] Community acquired pneumonia, unspecified laterality [J18.9] Sepsis, due to unspecified organism, unspecified whether acute organ dysfunction present (HCC) [A41.9]  DISCHARGE DIAGNOSIS:  sepsis on admission resolved aspiration pneumonia acute metabolic encephalopathy with underlying dementia shingles left chest wall rhabdomyolysis improved  SECONDARY DIAGNOSIS:   Past Medical History:  Diagnosis Date  . Dementia (HCC)   . Thyroid disease     HOSPITAL COURSE:  Parker Chambers is an 69 y.o. male with medical history significant for dementia, hypothyroidism.  He was referred from an independent living center to the emergency room because of generalized weakness and confusion.  Reportedly, he was laying in his own feces.  In the ER the patient was hypoxic with oxygen saturation in the upper 80s.  Work-up revealed sepsis secondary to pneumonia, probable aspiration pneumonia. He was also found to have lesions suspicious for shingles.  Sepsis (HCC)/lactic acidosis/pneumonia, concern for aspiration No growth on blood cultures thus far.  Change IV Unasyn to Augmentin  Total 7 days.  His sats are stable onroom air.  Dysphagia Aspiration precautions Speech therapist recommended dysphagia 3 diet.  Acute toxic metabolic encephalopathy with underlying dementia- Improved.  He appears back to baseline.  Continue supportive care.  shingles left chest wall Continue oral valacyclovir.  I do not see any evidence of groin cellulitis at this time.  Marland Kitchen  MRSA screening was negative.total 7  days  Rhabdomyolysis Improved.  CK level has normalized.    Elevated troponin- mildly , likely demand ischemia  2D echo showed EF estimated at 60 to 65% and grade 1 diastolic dysfunction.  Hypokalemia Replete potassium and monitor level  Hypothyroidism -TSH mildly elevated. nml FT4.recheck level in 4 weeks after acute setting resolves  Dementia -stable, home meds resumed--including seroquel -prn ativan 0.5 mg bid for agitiation  HTN -bp remained high overall during hospital stay. Start norvasc 10 mg qd  Body mass index is 42.04 kg/m.  (Morbid obesity)  best at baseline --d/c to rehab today Family Communication/Anticipated D/C date and plan/Code Status   DVT prophylaxis: Lovenox Code Status: Full code Family Communication: Plan discussed with his wife at the bedside--by dr Myriam Forehand  Disposition Plan: to [facility] today   Status is: Inpatient Dispo: The patient is from: Home  Anticipated d/c is to: SNF  Anticipated d/c date is: today  Patient currently is medically stable to d/c. CONSULTS OBTAINED:    DRUG ALLERGIES:  No Known Allergies  DISCHARGE MEDICATIONS:   Allergies as of 08/09/2019   No Known Allergies     Medication List    STOP taking these medications   UNABLE TO FIND     TAKE these medications   amLODipine 10 MG tablet Commonly known as: NORVASC Take 1 tablet (10 mg total) by mouth daily. Start taking on: Aug 10, 2019   amoxicillin-clavulanate 875-125 MG tablet Commonly known as: AUGMENTIN Take 1 tablet by mouth every 12 (twelve) hours for 3 days.   Cholecalciferol 25 MCG (1000 UT) tablet Take 1,000 Units by mouth daily.   diclofenac Sodium 1 % Gel Commonly known as: VOLTAREN Apply 2 g topically 4 (four) times daily.  donepezil 10 MG tablet Commonly known as: ARICEPT Take 10 mg by mouth at bedtime.   folic acid 1 MG tablet Commonly known as: FOLVITE Take 1 mg by mouth daily.    levothyroxine 125 MCG tablet Commonly known as: SYNTHROID Take 125 mcg by mouth daily before breakfast.   loratadine 10 MG tablet Commonly known as: CLARITIN Take 10 mg by mouth daily.   LORazepam 0.5 MG tablet Commonly known as: ATIVAN Take 1 tablet (0.5 mg total) by mouth 2 (two) times daily as needed for anxiety.   memantine 10 MG tablet Commonly known as: NAMENDA Take 10 mg by mouth 2 (two) times daily.   mirtazapine 30 MG tablet Commonly known as: REMERON Take 30 mg by mouth at bedtime.   omeprazole 20 MG capsule Commonly known as: PRILOSEC Take 20 mg by mouth daily.   QUEtiapine 100 MG tablet Commonly known as: SEROQUEL Take 100 mg by mouth at bedtime.   QUEtiapine 25 MG tablet Commonly known as: SEROQUEL Take 25 mg by mouth at bedtime.   traZODone 50 MG tablet Commonly known as: DESYREL Take 50 mg by mouth 2 (two) times daily.   triamcinolone cream 0.1 % Commonly known as: KENALOG Apply 1 application topically See admin instructions. Apply topically 2 times daily to affected areas for 7 to 10 days, take 7 days off. May repeat as needed. Then use twice a day as needed.   valACYclovir 1000 MG tablet Commonly known as: VALTREX Take 1 tablet (1,000 mg total) by mouth 3 (three) times daily for 3 days.       If you experience worsening of your admission symptoms, develop shortness of breath, life threatening emergency, suicidal or homicidal thoughts you must seek medical attention immediately by calling 911 or calling your MD immediately  if symptoms less severe.  You Must read complete instructions/literature along with all the possible adverse reactions/side effects for all the Medicines you take and that have been prescribed to you. Take any new Medicines after you have completely understood and accept all the possible adverse reactions/side effects.   Please note  You were cared for by a hospitalist during your hospital stay. If you have any questions about  your discharge medications or the care you received while you were in the hospital after you are discharged, you can call the unit and asked to speak with the hospitalist on call if the hospitalist that took care of you is not available. Once you are discharged, your primary care physician will handle any further medical issues. Please note that NO REFILLS for any discharge medications will be authorized once you are discharged, as it is imperative that you return to your primary care physician (or establish a relationship with a primary care physician if you do not have one) for your aftercare needs so that they can reassess your need for medications and monitor your lab values. Today   SUBJECTIVE  Some baseline confusion--eating with CNA feeding him   VITAL SIGNS:  Blood pressure (!) 181/110, pulse 78, temperature 98.5 F (36.9 C), temperature source Oral, resp. rate 20, height 6' (1.829 m), weight (!) 140.6 kg, SpO2 94 %.  I/O:    Intake/Output Summary (Last 24 hours) at 08/09/2019 0948 Last data filed at 08/09/2019 0509 Gross per 24 hour  Intake 400 ml  Output 0 ml  Net 400 ml    PHYSICAL EXAMINATION:  GENERAL:  69 y.o.-year-old patient lying in the bed with no acute distress.  EYES: Pupils equal,  round, reactive to light and accommodation. No scleral icterus.  LUNGS: Normal breath sounds bilaterally, no wheezing, rales,rhonchi or crepitation. No use of accessory muscles of respiration. Dried lesions on the chest CARDIOVASCULAR: S1, S2 normal. No murmurs, rubs, or gallops.  ABDOMEN: Soft, non-tender, non-distended. Bowel sounds present. No organomegaly or mass.  EXTREMITIES: No pedal edema, cyanosis, or clubbing.  NEUROLOGIC:non focal PSYCHIATRIC: The patient is alert but confused  SKIN: per RN  DATA REVIEW:   CBC  Recent Labs  Lab 08/07/19 0634  WBC 8.0  HGB 13.1  HCT 40.3  PLT 197    Chemistries  Recent Labs  Lab 08/05/19 0433 08/06/19 0535 08/07/19 0634  08/07/19 0634 08/07/19 1553 08/08/19 0452  NA 142   < > 139  --   --   --   K 4.1   < > 3.3*   < >  --  3.4*  CL 108   < > 106  --   --   --   CO2 26   < > 27  --   --   --   GLUCOSE 114*   < > 105*  --   --   --   BUN 18   < > 12  --   --   --   CREATININE 1.00   < > 0.85  --   --   --   CALCIUM 8.2*   < > 8.0*  --   --   --   MG  --   --   --   --  1.8  --   AST 48*  --   --   --   --   --   ALT 21  --   --   --   --   --   ALKPHOS 54  --   --   --   --   --   BILITOT 1.0  --   --   --   --   --    < > = values in this interval not displayed.    Microbiology Results   Recent Results (from the past 240 hour(s))  Blood culture (routine x 2)     Status: None   Collection Time: 08/04/19  7:49 PM   Specimen: BLOOD  Result Value Ref Range Status   Specimen Description BLOOD BLOOD RIGHT HAND  Final   Special Requests   Final    BOTTLES DRAWN AEROBIC AND ANAEROBIC Blood Culture results may not be optimal due to an excessive volume of blood received in culture bottles   Culture   Final    NO GROWTH 5 DAYS Performed at [facility], [address] [place], [place] [address]    Report Status 08/09/2019 FINAL  Final  Blood culture (routine x 2)     Status: None   Collection Time: 08/04/19  7:49 PM   Specimen: BLOOD  Result Value Ref Range Status   Specimen Description BLOOD BLOOD LEFT FOREARM  Final   Special Requests   Final    BOTTLES DRAWN AEROBIC AND ANAEROBIC Blood Culture adequate volume   Culture   Final    NO GROWTH 5 DAYS Performed at [facility], [address] [place], [place] [address]    Report Status 08/09/2019 FINAL  Final  SARS Coronavirus 2 by RT PCR (hospital order, performed in [place] hospital lab) Nasopharyngeal Nasopharyngeal Swab     Status: None   Collection Time:  08/04/19  7:49 PM   Specimen: Nasopharyngeal Swab  Result Value Ref Range Status   SARS Coronavirus 2 NEGATIVE NEGATIVE Final    Comment: (NOTE) SARS-CoV-2 target  nucleic acids are NOT DETECTED. The SARS-CoV-2 RNA is generally detectable in upper and lower respiratory specimens during the acute phase of infection. The lowest concentration of SARS-CoV-2 viral copies this assay can detect is 250 copies / mL. A negative result does not preclude SARS-CoV-2 infection and should not be used as the sole basis for treatment or other patient management decisions.  A negative result may occur with improper specimen collection / handling, submission of specimen other than nasopharyngeal swab, presence of viral mutation(s) within the areas targeted by this assay, and inadequate number of viral copies (<250 copies / mL). A negative result must be combined with clinical observations, patient history, and epidemiological information. Fact Sheet for Patients:   BoilerBrush.com.cy Fact Sheet for Healthcare Providers: https://pope.com/ This test is not yet approved or cleared  by the [place] FDA and has been authorized for detection and/or diagnosis of SARS-CoV-2 by FDA under an Emergency Use Authorization (EUA).  This EUA will remain in effect (meaning this test can be used) for the duration of the COVID-19 declaration under Section 564(b)(1) of the Act, 21 U.S.C. section 360bbb-3(b)(1), unless the authorization is terminated or revoked sooner. Performed at [facility], [address] [place], [place] [address]   Urine Culture     Status: None   Collection Time: 08/04/19  7:49 PM   Specimen: Urine, Random  Result Value Ref Range Status   Specimen Description   Final    URINE, RANDOM Performed at [facility], [address], [place], [place] [address]    Special Requests   Final    NONE Performed at [facility], [address] [place], [place] [address]    Culture   Final    NO GROWTH Performed at [facility], [address], [place], [place] [address]     Report Status 08/05/2019 FINAL  Final  MRSA PCR Screening     Status: None   Collection Time: 08/06/19  2:22 PM   Specimen: Nasopharyngeal  Result Value Ref Range Status   MRSA by PCR NEGATIVE NEGATIVE Final    Comment:        The GeneXpert MRSA Assay (FDA approved for NASAL specimens only), is one component of a comprehensive MRSA colonization surveillance program. It is not intended to diagnose MRSA infection nor to guide or monitor treatment for MRSA infections. Performed at [facility], [address] [place], [place] [address]   SARS Coronavirus 2 by RT PCR (hospital order, performed in [facility] hospital lab) Nasopharyngeal Nasopharyngeal Swab     Status: None   Collection Time: 08/08/19 12:38 PM   Specimen: Nasopharyngeal Swab  Result Value Ref Range Status   SARS Coronavirus 2 NEGATIVE NEGATIVE Final    Comment: (NOTE) SARS-CoV-2 target nucleic acids are NOT DETECTED. The SARS-CoV-2 RNA is generally detectable in upper and lower respiratory specimens during the acute phase of infection. The lowest concentration of SARS-CoV-2 viral copies this assay can detect is 250 copies / mL. A negative result does not preclude SARS-CoV-2 infection and should not be used as the sole basis for treatment or other patient management decisions.  A negative result may occur with improper specimen collection / handling, submission of specimen other than nasopharyngeal swab, presence of viral mutation(s) within the areas targeted by  this assay, and inadequate number of viral copies (<250 copies / mL). A negative result must be combined with clinical observations, patient history, and epidemiological information. Fact Sheet for Patients:   BoilerBrush.com.cyhttps://www.fda.gov/media/136312/download Fact Sheet for Healthcare Providers: https://pope.com/https://www.fda.gov/media/136313/download This test is not yet approved or cleared  by the [place] FDA and has been authorized for detection and/or  diagnosis of SARS-CoV-2 by FDA under an Emergency Use Authorization (EUA).  This EUA will remain in effect (meaning this test can be used) for the duration of the COVID-19 declaration under Section 564(b)(1) of the Act, 21 U.S.C. section 360bbb-3(b)(1), unless the authorization is terminated or revoked sooner. Performed at [facility], [address] [place], [place] [address]     RADIOLOGY:  No results found.   CODE STATUS:     Code Status Orders  (From admission, onward)         Start     Ordered   08/05/19 0018  Full code  Continuous     08/05/19 0017        Code Status History    This patient has a current code status but no historical code status.   Advance Care Planning Activity       TOTAL TIME TAKING CARE OF THIS PATIENT: *40* minutes.    Enedina FinnerSona Georgianna Band M.D  [facility]    CC: Primary care physician; Whitaker, [employer] CorporationJason Hestle, PA-C

## 2019-08-30 ENCOUNTER — Telehealth: Payer: Self-pay | Admitting: Adult Health Nurse Practitioner

## 2019-08-30 NOTE — Telephone Encounter (Signed)
Spoke with sister in law, [name], and set up time on Monday, 09/02/19, at 10:30 am to have initial consult.  Has contact info if needs to change this Parker Chambers K. Parker Nash NP

## 2019-09-04 ENCOUNTER — Encounter
Admission: RE | Admit: 2019-09-04 | Discharge: 2019-09-04 | Disposition: A | Discharge status: Home / Self Care | Payer: Self-pay | Source: Ambulatory Visit | Attending: Internal Medicine | Admitting: Internal Medicine

## 2019-09-06 ENCOUNTER — Non-Acute Institutional Stay: Payer: Self-pay | Admitting: Adult Health Nurse Practitioner

## 2019-09-06 ENCOUNTER — Other Ambulatory Visit: Payer: Self-pay

## 2019-09-06 DIAGNOSIS — G309 Alzheimer's disease, unspecified: Secondary | ICD-10-CM

## 2019-09-06 DIAGNOSIS — F0280 Dementia in other diseases classified elsewhere without behavioral disturbance: Secondary | ICD-10-CM

## 2019-09-06 DIAGNOSIS — Z515 Encounter for palliative care: Secondary | ICD-10-CM

## 2019-09-06 NOTE — Progress Notes (Signed)
[facility] Consult Note Telephone: [phone]  Fax: [phone]  PATIENT NAME: Parker Chambers DOB: Apr 15, 1950 MRN: [id number]  PRIMARY CARE PROVIDER:   Donnamarie Rossetti, PA-C  REFERRING PROVIDER:  Dr. Ouida Sills  RESPONSIBLE PARTY:   Laurie Panda, sister in law/primary contact  [phone] Que Meneely, wife  [phone]    RECOMMENDATIONS and PLAN:  1.  Advanced care planning.  Patient is DNR  2.  Dementia.  FAST 7a.  Patient is able to walk with a walker.  Requires assistance with ADLs but is able to feed himself.  Appetite is good with no reported weight loss.  Patient is incontinent of bowel and bladder.  Patient was living at home with wife in independent living for [facility].  Patient had hospitalization on 5/16 through 08/09/2019 in which she had sepsis due to pneumonia he also had shingles.  Patient has been receiving rehab at [facility] assisted living.  Patient was weak after hospitalization but seems to be back at baseline.  Patient has had several episodes at the facility in which he has been combative and resistant to care.  At times has been reported to trying to hit or kick staff.  Patient seen to be calming down and having less episodes of agitation/anxiety.  Patient is currently on Aricept 10 mg nightly, Ativan 0.5 mg twice daily as needed, Namenda 10 mg twice daily, Remeron 30 mg nightly, Seroquel 125 mg nightly, and trazodone 50 mg twice daily.  Family wants to bring him back home to independent living [place].  Prior to hospitalization they had help from home instead from 7 AM to 9 PM.  They are encouraged to have 24/7 care now.  Family is trying to secure 24/7 care in the home prior to bringing patient home.  Home instead agency is insisting that patient requires CNA's and not just someone who can sit with him and help when needed.  Family states that they have looked into it several agencies and are working on  get 24/7 care.  Family's goal is to bring the patient home with 24/7 care.  They will arrange for discharge from ALF once they have procured 24/7 care in the home for the patient.  Did have discussion about disease progression and that with changes in environment the patient can become more agitated and anxious.  Continue supportive care at the facility.  Patient has not had any falls.  Denies increased shortness of breath or cough, pain, nausea, vomiting, diarrhea, constipation, chest pain, edema.  Family encouraged to let provider know when transferred back home and will pursue new order for palliative care if family wants to continue with palliative.  Family was given this provider's contact information and encouraged to call with any concerns or questions.  I spent 90 minutes providing this consultation,  from 11:00 to 12:30 including time spent with patient/family, chart review, provider coordination, documentation. More than 50% of the time in this consultation was spent coordinating communication.   HISTORY OF PRESENT ILLNESS:  Parker Chambers is a 69 y.o. year old male with multiple medical problems including dementia, hypothyroidism. Palliative Care was asked to help address goals of care.   CODE STATUS: DNR  PPS: 40% HOSPICE ELIGIBILITY/DIAGNOSIS: TBD  PHYSICAL EXAM:  BP  128/72  HR  87  O2 99% on RA General: Patient sitting in chair in NAD Cardiovascular: regular rate and rhythm Pulmonary: lung sounds clear; normal respiratory effort Abdomen: soft, nontender, + bowel  sounds GU: no suprapubic tenderness Extremities: trace edema to feet, no joint deformities Skin: no rashes on exposed skin Neurological: Weakness; A&O to person   PAST MEDICAL HISTORY:  Past Medical History:  Diagnosis Date   Dementia (HCC)    Thyroid disease     SOCIAL HX:  Social History   Tobacco Use   Smoking status: Former Smoker    Quit date: 02/27/1993    Years since quitting: 26.5   Smokeless  tobacco: Never Used  Substance Use Topics   Alcohol use: Never    ALLERGIES: No Known Allergies   PERTINENT MEDICATIONS:  Outpatient Encounter Medications as of 09/06/2019  Medication Sig   amLODipine (NORVASC) 10 MG tablet Take 1 tablet (10 mg total) by mouth daily.   Cholecalciferol 25 MCG (1000 UT) tablet Take 1,000 Units by mouth daily.   diclofenac Sodium (VOLTAREN) 1 % GEL Apply 2 g topically 4 (four) times daily.   donepezil (ARICEPT) 10 MG tablet Take 10 mg by mouth at bedtime.   folic acid (FOLVITE) 1 MG tablet Take 1 mg by mouth daily.   levothyroxine (SYNTHROID) 125 MCG tablet Take 125 mcg by mouth daily before breakfast.    loratadine (CLARITIN) 10 MG tablet Take 10 mg by mouth daily.   LORazepam (ATIVAN) 0.5 MG tablet Take 1 tablet (0.5 mg total) by mouth 2 (two) times daily as needed for anxiety.   memantine (NAMENDA) 10 MG tablet Take 10 mg by mouth 2 (two) times daily.   mirtazapine (REMERON) 30 MG tablet Take 30 mg by mouth at bedtime.   omeprazole (PRILOSEC) 20 MG capsule Take 20 mg by mouth daily.   QUEtiapine (SEROQUEL) 100 MG tablet Take 100 mg by mouth at bedtime.   QUEtiapine (SEROQUEL) 25 MG tablet Take 25 mg by mouth at bedtime.   traZODone (DESYREL) 50 MG tablet Take 50 mg by mouth 2 (two) times daily.   triamcinolone cream (KENALOG) 0.1 % Apply 1 application topically See admin instructions. Apply topically 2 times daily to affected areas for 7 to 10 days, take 7 days off. May repeat as needed. Then use twice a day as needed.   No facility-administered encounter medications on file as of 09/06/2019.     Demitri Kucinski Marlena Clipper, NP

## 2019-09-11 ENCOUNTER — Telehealth: Payer: Self-pay | Admitting: Adult Health Nurse Practitioner

## 2019-09-11 NOTE — Telephone Encounter (Signed)
Returned sister in law's VM about discussing roles of different home health agencies.  Left VM with reason for call and call back info Jemarion Roycroft K. Garner Nash NP

## 2019-09-11 NOTE — Telephone Encounter (Signed)
Spoke with sister in law, [name].  She had questions about process if they needed to have patient readmitted to facility if unable to get adequate care in the home.  Attempted to answer her questions. Set up follow up appointment for 09/27/19 at 1pm Uthman Mroczkowski K. Garner Nash NP

## 2019-09-19 ENCOUNTER — Telehealth: Payer: Self-pay | Admitting: Adult Health Nurse Practitioner

## 2019-09-19 NOTE — Telephone Encounter (Signed)
Returned sister in law's call.  Wanted to schedule a sooner appointment than 09/27/19 as patient is not doing well back at home.  She stated having an appointment with DON and SW at the [facility] tomorrow at 11am.  Have asked if we could make this a joint visit and SIL in agreement with this. Have reached out to DON and SW with this request via email.  So will meet with patient and family on 09/20/19 at 11:30 Joclyn Alsobrook K. Garner Nash NP

## 2019-09-20 ENCOUNTER — Other Ambulatory Visit: Payer: Medicare Other | Admitting: Adult Health Nurse Practitioner

## 2019-09-20 ENCOUNTER — Other Ambulatory Visit: Payer: Self-pay

## 2019-09-20 DIAGNOSIS — G309 Alzheimer's disease, unspecified: Secondary | ICD-10-CM

## 2019-09-20 DIAGNOSIS — Z515 Encounter for palliative care: Secondary | ICD-10-CM

## 2019-09-20 DIAGNOSIS — F0280 Dementia in other diseases classified elsewhere without behavioral disturbance: Secondary | ICD-10-CM

## 2019-09-20 NOTE — Progress Notes (Signed)
Therapist, nutritional Palliative Care Consult Note Telephone: [phone]  Fax: [phone]  PATIENT NAME: Parker Chambers DOB: 09-27-1950 MRN: [id number]  PRIMARY CARE PROVIDER:   Wilford Corner, PA-C  REFERRING PROVIDER:  Wilford Corner, PA-C [address] [place],  [place] [address]  RESPONSIBLE PARTY:   Parker Chambers, sister in law/primary contact  [phone] Parker Chambers, wife  [phone]    RECOMMENDATIONS and PLAN:  1.  Advanced care planning.  Patient is DNR  2.  Dementia.  FAST 7a.  Patient is able to walk with a walker.  Requires assistance with ADLs.  Is requiring more assistance with feeding.  Patient does better with finger foods.  Appetite is good with no reported weight loss.  Patient is incontinent of bowel and bladder.  Patient has been back at home and [facility] for about 2 weeks now.  Caregiver reports that patient is sleeping more and due to being more tired he does not walk does not walk as much as he used to.  He sleeps well at night. He is having edema to feet. Denies increased SOB or cough, PND, orthopnea, fever, pain, N/V/D, constipation, or changes in urinary habits.  Have encouraged propping up feet during the day and low salt intake.Patient does not engage in conversation.  Caregiver and family state that they have noticed him reaching for things they do not see; he may be having visual hallucinations. Continue supportive care at home.    3.  Support.  Family has had not very good experience with getting 24/7 care in the home for the patient.  Patient will get agitated at times when aides provide care.  He does have PRN ativan that does not seem to help.  Some aides are able to work with him with no problems.  Appears more to be related to how the caregiver approaches him.  Agencies are working with family to get aides that are right fit for the patient.  In the meantime this is putting a lot of stress on the wife  and other family members.  They are considering possibly placing him back in the ALF at [facility].  This is stressful for the wife because she wants to keep him at home as much as possible.  Have encouraged them to make a list of pros and cons for staying at home and returning to ALF and comparing which may meet the patient's needs the best.  Talked about safety issues that would be indicators for placement in facility vs. Staying at home.  This was a lengthy conversation and wife is struggling with having the extra caregivers in her home vs. Making the decision to place in ALF.  Both scenarios are causing her issues with trust and her own comfort levels.  Did not feel like this was the best conversation and wife was getting upset.  Not with provider but more with the decisions she has to make.  Will have SW reach out to her to see if she can better help with the wife's anxiety over these decisions.  Palliative care will continue to monitor for symptom management/decline and make recommendations as needed. Will continue to offer support for the family during any transitions of care.  Will call for follow up in 2 weeks   I spent 150 minutes providing this consultation,  from 11:30 to 2:00 including time spent with patient/family, chart review, provider coordination, documentation. More than 50% of the time in this  consultation was spent coordinating communication.   HISTORY OF PRESENT ILLNESS:  Parker Chambers is a 69 y.o. year old male with multiple medical problems including dementia, hypothyroidism. Palliative Care was asked to help address goals of care.   CODE STATUS: DNR  PPS: 40% HOSPICE ELIGIBILITY/DIAGNOSIS: TBD  PHYSICAL EXAM:  BP 130/78  HR 88 O2 90% on RA  General: NAD, frail appearing Cardiovascular: regular rate and rhythm Pulmonary: lung sounds clear; normal respiratory effort Abdomen: soft, nontender, + bowel sounds GU: no suprapubic tenderness Extremities: trace to  1+ edema of bilateral lower extremities, no joint deformities Skin: no rashes on exposed skin Neurological: Weakness; A&O to person and place  PAST MEDICAL HISTORY:  Past Medical History:  Diagnosis Date  . Dementia (HCC)   . Thyroid disease     SOCIAL HX:  Social History   Tobacco Use  . Smoking status: Former Smoker    Quit date: 02/27/1993    Years since quitting: 26.5  . Smokeless tobacco: Never Used  Substance Use Topics  . Alcohol use: Never    ALLERGIES: No Known Allergies   PERTINENT MEDICATIONS:  Outpatient Encounter Medications as of 09/20/2019  Medication Sig  . amLODipine (NORVASC) 10 MG tablet Take 1 tablet (10 mg total) by mouth daily.  . Cholecalciferol 25 MCG (1000 UT) tablet Take 1,000 Units by mouth daily.  . diclofenac Sodium (VOLTAREN) 1 % GEL Apply 2 g topically 4 (four) times daily.  Marland Kitchen donepezil (ARICEPT) 10 MG tablet Take 10 mg by mouth at bedtime.  . folic acid (FOLVITE) 1 MG tablet Take 1 mg by mouth daily.  Marland Kitchen levothyroxine (SYNTHROID) 125 MCG tablet Take 125 mcg by mouth daily before breakfast.   . loratadine (CLARITIN) 10 MG tablet Take 10 mg by mouth daily.  Marland Kitchen LORazepam (ATIVAN) 0.5 MG tablet Take 1 tablet (0.5 mg total) by mouth 2 (two) times daily as needed for anxiety.  . memantine (NAMENDA) 10 MG tablet Take 10 mg by mouth 2 (two) times daily.  . mirtazapine (REMERON) 30 MG tablet Take 30 mg by mouth at bedtime.  Marland Kitchen omeprazole (PRILOSEC) 20 MG capsule Take 20 mg by mouth daily.  . QUEtiapine (SEROQUEL) 100 MG tablet Take 100 mg by mouth at bedtime.  Marland Kitchen QUEtiapine (SEROQUEL) 25 MG tablet Take 25 mg by mouth at bedtime.  . traZODone (DESYREL) 50 MG tablet Take 50 mg by mouth 2 (two) times daily.  Marland Kitchen triamcinolone cream (KENALOG) 0.1 % Apply 1 application topically See admin instructions. Apply topically 2 times daily to affected areas for 7 to 10 days, take 7 days off. May repeat as needed. Then use twice a day as needed.   No facility-administered  encounter medications on file as of 09/20/2019.      Parker Chambers Parker Clipper, NP

## 2019-09-25 ENCOUNTER — Telehealth: Payer: Self-pay

## 2019-09-25 DIAGNOSIS — Z515 Encounter for palliative care: Secondary | ICD-10-CM

## 2019-09-25 NOTE — Telephone Encounter (Signed)
SW made TC to SIL, Jil, to discuss patients LTC placement plans. SIL shared she and patients wife placed patient into [facility] SNF today. Her concern now is his wife mental health - depression. Wife has a psychologist that she sees and has recently been prescribed Lexapro 3mg , she has a f/u 7/23 to see if it has been effective. I encouraged her to reach out to both the psychiatrist and psychologist about her concerns of wifes behaviors and mood. She shared that the wife has decided to keep some of the caregiver hours for herself in the home, which sister feels is a good thing to have someone there with her.   SW provided the sister with contact information for mobile crisis line for wife as well as Alzheimers support information.  She did not have any further concerns or needs about the patient for me.

## 2019-11-08 ENCOUNTER — Non-Acute Institutional Stay: Payer: Medicare Other | Admitting: Adult Health Nurse Practitioner

## 2019-11-08 ENCOUNTER — Other Ambulatory Visit: Payer: Self-pay

## 2019-11-08 DIAGNOSIS — F0280 Dementia in other diseases classified elsewhere without behavioral disturbance: Secondary | ICD-10-CM

## 2019-11-08 DIAGNOSIS — Z515 Encounter for palliative care: Secondary | ICD-10-CM

## 2019-11-08 DIAGNOSIS — G309 Alzheimer's disease, unspecified: Secondary | ICD-10-CM

## 2019-11-08 NOTE — Progress Notes (Signed)
Therapist, nutritional Palliative Care Consult Note Telephone: [phone]  Fax: [phone]  PATIENT NAME: Parker Chambers DOB: 1950/07/02 MRN: [id number]  PRIMARY CARE PROVIDER:   Wilford Corner, PA-C  REFERRING PROVIDER:  Dr. Vella Kohler  RESPONSIBLE PARTY:   Parker Chambers, sister in law/primary contact [phone] Parker Chambers, wife [phone]    RECOMMENDATIONS and PLAN:  1.  Advanced care planning. Patient is DNR.  Called sister in law to update on visit. Left VM with reason for call and contact info.  2.  Dementia.  FAST 7a.  Patient is able to walk with a walker. Requires assistance with ADLs.  Is requiring more assistance with feeding.  Patient does better with finger foods.  Appetite is good with no reported weight loss.  Patient is incontinent of bowel and bladder. Patient has been in SNF for about 5-6 weeks and staff states that he is acclimating well.  He occasionally has trouble sleeping and will wander the hallway.  Will sleep in late when he doesn't sleep well.  Will have anxiety and be combative at times and gets relief with PRN ativan.  He did have a fall about a month ago.  Appetite varies but weight is stable.  Current weight is 226.4 pounds.  Continue supportive care at home.  No reported increased SOB, wounds, fever, N/V/D, constipation, pain.  No infections or hospitalizations since last visit. Palliative will continue to monitor for symptom management/decline and make recommendations as needed.  Will follow up in 4-6 weeks and as needed.  I spent 30 minutes providing this consultation,  from 10:30 to 11:00 including time spent with patient/family, chart review, provider coordination, documentation. More than 50% of the time in this consultation was spent coordinating communication.   HISTORY OF PRESENT ILLNESS:  Parker Chambers is a 69 y.o. year old male with multiple medical problems including dementia, hypothyroidism.  Palliative Care was asked to help address goals of care.   CODE STATUS: DNR  PPS: 40% HOSPICE ELIGIBILITY/DIAGNOSIS: TBD  PHYSICAL EXAM:  HR 87 O2 94% on RA  General: NAD, frail appearing Cardiovascular: regular rate and rhythm Pulmonary: lung sounds clear; normal respiratory effort Abdomen: soft, nontender, + bowel sounds GU: no suprapubic tenderness Extremities: trace edema of bilateral feet, no joint deformities Skin: no rashes on exposed skin Neurological: Weakness; A&O to person and place  PAST MEDICAL HISTORY:  Past Medical History:  Diagnosis Date  . Dementia (HCC)   . Thyroid disease     SOCIAL HX:  Social History   Tobacco Use  . Smoking status: Former Smoker    Quit date: 02/27/1993    Years since quitting: 26.7  . Smokeless tobacco: Never Used  Substance Use Topics  . Alcohol use: Never    ALLERGIES: No Known Allergies   PERTINENT MEDICATIONS:  Outpatient Encounter Medications as of 11/08/2019  Medication Sig  . amLODipine (NORVASC) 10 MG tablet Take 1 tablet (10 mg total) by mouth daily.  . Cholecalciferol 25 MCG (1000 UT) tablet Take 1,000 Units by mouth daily.  . diclofenac Sodium (VOLTAREN) 1 % GEL Apply 2 g topically 4 (four) times daily.  Marland Kitchen donepezil (ARICEPT) 10 MG tablet Take 10 mg by mouth at bedtime.  . folic acid (FOLVITE) 1 MG tablet Take 1 mg by mouth daily.  Marland Kitchen levothyroxine (SYNTHROID) 125 MCG tablet Take 125 mcg by mouth daily before breakfast.   . loratadine (CLARITIN) 10 MG tablet Take 10 mg by mouth daily.  Marland Kitchen LORazepam (ATIVAN)  0.5 MG tablet Take 1 tablet (0.5 mg total) by mouth 2 (two) times daily as needed for anxiety.  . memantine (NAMENDA) 10 MG tablet Take 10 mg by mouth 2 (two) times daily.  . mirtazapine (REMERON) 30 MG tablet Take 30 mg by mouth at bedtime.  Marland Kitchen omeprazole (PRILOSEC) 20 MG capsule Take 20 mg by mouth daily.  . QUEtiapine (SEROQUEL) 100 MG tablet Take 100 mg by mouth at bedtime.  Marland Kitchen QUEtiapine (SEROQUEL) 25 MG tablet  Take 25 mg by mouth at bedtime.  . traZODone (DESYREL) 50 MG tablet Take 50 mg by mouth 2 (two) times daily.  Marland Kitchen triamcinolone cream (KENALOG) 0.1 % Apply 1 application topically See admin instructions. Apply topically 2 times daily to affected areas for 7 to 10 days, take 7 days off. May repeat as needed. Then use twice a day as needed.   No facility-administered encounter medications on file as of 11/08/2019.     Adelie Croswell Marlena Clipper, NP

## 2019-11-22 ENCOUNTER — Encounter
Admission: RE | Admit: 2019-11-22 | Discharge: 2019-11-22 | Disposition: A | Discharge status: Home / Self Care | Payer: Medicare Other | Source: Ambulatory Visit | Attending: Internal Medicine | Admitting: Internal Medicine

## 2020-01-17 ENCOUNTER — Non-Acute Institutional Stay: Payer: Medicare Other | Admitting: Adult Health Nurse Practitioner

## 2020-01-17 ENCOUNTER — Other Ambulatory Visit: Payer: Self-pay

## 2020-01-17 DIAGNOSIS — F0280 Dementia in other diseases classified elsewhere without behavioral disturbance: Secondary | ICD-10-CM

## 2020-01-17 DIAGNOSIS — Z515 Encounter for palliative care: Secondary | ICD-10-CM

## 2020-01-17 NOTE — Progress Notes (Signed)
Therapist, nutritional Palliative Care Consult Note Telephone: [phone]  Fax: [phone]  PATIENT NAME: Parker Chambers DOB: May 04, 1950 MRN: [id number]  PRIMARY CARE PROVIDER:   Wilford Corner, PA-C  REFERRING PROVIDER: Dr. Vella Kohler  RESPONSIBLE PARTY:   Parker Chambers, sister in law/primary contact [phone] Parker Chambers, wife [phone]   RECOMMENDATIONS and PLAN: 1.Advanced care planning. Patient is DNR.  Spoke with sister in law, Parker Chambers, to update on visit.   2.  Dementia.  FAST 7a.  Patient is able to walk with a walker. Does need to be reminded to use his walker.Requires assistance with ADLs.Is requiring more assistance with feeding. Patient does better with finger foods. Appetite is good.Current weight is 222.3 which is down 4 pounds from august weight of 226.4.Patient is incontinent of bowel and bladder.  Will have anxiety and be combative at times and gets relief with PRN ativan. Continue supportive care at facility  No reported increased SOB, wounds, fever, N/V/D, constipation, pain.  No infections or hospitalizations since last visit. Did have a fall 2-3 weeks with no injury. Palliative will continue to monitor for symptom management/decline and make recommendations as needed.  Will follow up in 6-8 weeks and as needed.  I spent 30 minutes providing this consultation,  from 9:30 to 10:00 including time spent with patient/family, chart review, provider coordination, documentation. More than 50% of the time in this consultation was spent coordinating communication.   HISTORY OF PRESENT ILLNESS:  Parker Chambers is a 69 y.o. year old male with multiple medical problems including dementia, hypothyroidism. Palliative Care was asked to help address goals of care. Sister in law did have concerns about him not getting a shower.  Staff are giving him sponge baths.  He was clean today with no apparent hygiene issues.  Patient was  smiling today and seemed happier than at previous visit.  Unable to contribute to ROS secondary to dementia.  CODE STATUS: DNR  PPS: 40% HOSPICE ELIGIBILITY/DIAGNOSIS: TBD  PHYSICAL EXAM: HR 74 O2 98% on RA  General: NAD, frail appearing Cardiovascular: regular rate and rhythm Pulmonary:lung sounds clear; normal respiratory effort Abdomen: soft, nontender, + bowel sounds GU: no suprapubic tenderness Extremities:traceedema of bilateral feet, no joint deformities Skin: no rasheson exposed skin Neurological: Weakness; A&O to person and place  PAST MEDICAL HISTORY:  Past Medical History:  Diagnosis Date  . Dementia (HCC)   . Thyroid disease     SOCIAL HX:  Social History   Tobacco Use  . Smoking status: Former Smoker    Quit date: 02/27/1993    Years since quitting: 26.9  . Smokeless tobacco: Never Used  Substance Use Topics  . Alcohol use: Never    ALLERGIES: No Known Allergies   PERTINENT MEDICATIONS:  Outpatient Encounter Medications as of 01/17/2020  Medication Sig  . amLODipine (NORVASC) 10 MG tablet Take 1 tablet (10 mg total) by mouth daily.  . Cholecalciferol 25 MCG (1000 UT) tablet Take 1,000 Units by mouth daily.  Marland Kitchen donepezil (ARICEPT) 10 MG tablet Take 10 mg by mouth at bedtime.  Marland Kitchen levothyroxine (SYNTHROID) 125 MCG tablet Take 125 mcg by mouth daily before breakfast.   . loratadine (CLARITIN) 10 MG tablet Take 10 mg by mouth daily.  Marland Kitchen LORazepam (ATIVAN) 0.5 MG tablet Take 1 tablet (0.5 mg total) by mouth 2 (two) times daily as needed for anxiety.  . memantine (NAMENDA) 10 MG tablet Take 10 mg by mouth 2 (two) times daily.  . mirtazapine (REMERON) 30  MG tablet Take 30 mg by mouth at bedtime.  Marland Kitchen omeprazole (PRILOSEC) 20 MG capsule Take 20 mg by mouth daily.  . QUEtiapine (SEROQUEL) 100 MG tablet Take 100 mg by mouth at bedtime.  Marland Kitchen QUEtiapine (SEROQUEL) 25 MG tablet Take 25 mg by mouth at bedtime.  . traZODone (DESYREL) 50 MG tablet Take 50 mg by mouth 2  (two) times daily.  Marland Kitchen triamcinolone cream (KENALOG) 0.1 % Apply 1 application topically See admin instructions. Apply topically 2 times daily to affected areas for 7 to 10 days, take 7 days off. May repeat as needed. Then use twice a day as needed.   No facility-administered encounter medications on file as of 01/17/2020.     Parker Chambers Parker Clipper, NP

## 2020-04-29 ENCOUNTER — Other Ambulatory Visit: Payer: Self-pay

## 2020-04-29 ENCOUNTER — Non-Acute Institutional Stay: Payer: Medicare Other | Admitting: Adult Health Nurse Practitioner

## 2020-04-29 DIAGNOSIS — Z515 Encounter for palliative care: Secondary | ICD-10-CM

## 2020-04-29 DIAGNOSIS — F0280 Dementia in other diseases classified elsewhere without behavioral disturbance: Secondary | ICD-10-CM

## 2020-04-29 NOTE — Progress Notes (Signed)
Therapist, nutritional Palliative Care Consult Note Telephone: [phone]  Fax: [phone]  PATIENT NAME: Parker Chambers DOB: Sep 24, 1950 MRN: [id number]  PRIMARY CARE PROVIDER:   Dr. Einar Crow  REFERRING PROVIDER:  Dr. Vella Kohler  RESPONSIBLE PARTY:Jill Lynford Humphrey, sister in law/primary contact [phone] Toddy Boyd, wife [phone]  Chief complaint:  Follow up palliative visit/dementia   RECOMMENDATIONS and PLAN: 1.Advanced care planning. Patient is DNR.  Will call family to update on visit.  2.  Dementia.  FAST  7a.  Patient is stable.  Does walk with a walker but needs to be reminded often.  Requires assistance with ADLs including feeding at times.  He is incontinent of bowel and bladder.  Patient babbles when he speaks.  Unable to understand what he is trying to say.  Patient is pleasant to during exam today.  Continue supportive care at facility.  Palliative will continue to monitor for symptom management/decline and make recommendations as needed we will follow up in 6 to 8 weeks and as needed  I spent 35 minutes providing this consultation, including time spent with patient/family, provider coordination, chart review, documentation. More than 50% of the time in this consultation was spent coordinating communication.   HISTORY OF PRESENT ILLNESS:  Parker Chambers is a 70 y.o. year old male with multiple medical problems including dementia, hypothyroidism. Palliative Care was asked to help address goals of care. Palliative Care was asked to help address goals of care.  Patient is unable to participate with HPI/ROS due to dementia.  Staff have no new concerns.  Patient's appetite is good and weight is stable in the 220s.  Patient has not had any infections or hospitalizations since last visit  CODE STATUS: DNR  PPS: 40% HOSPICE ELIGIBILITY/DIAGNOSIS: TBD  PHYSICAL EXAM: HR 93O2 97% on RA  General: NAD, frail  appearing Eyes: sclera anicteric and noninjected with no discharge noted Cardiovascular: regular rate and rhythm Pulmonary:lung sounds clear; normal respiratory effort Abdomen: soft, nontender, + bowel sounds Extremities:traceedema of bilateralfeet, no joint deformities Skin: no rasheson exposed skin Neurological: Weakness; A&O to person and place  PAST MEDICAL HISTORY:  Past Medical History:  Diagnosis Date  . Dementia (HCC)   . Thyroid disease     SOCIAL HX:  Social History   Tobacco Use  . Smoking status: Former Smoker    Quit date: 02/27/1993    Years since quitting: 27.1  . Smokeless tobacco: Never Used  Substance Use Topics  . Alcohol use: Never    ALLERGIES: No Known Allergies   PERTINENT MEDICATIONS:  Outpatient Encounter Medications as of 04/29/2020  Medication Sig  . amLODipine (NORVASC) 10 MG tablet Take 1 tablet (10 mg total) by mouth daily.  . Cholecalciferol 25 MCG (1000 UT) tablet Take 1,000 Units by mouth daily.  Marland Kitchen donepezil (ARICEPT) 10 MG tablet Take 10 mg by mouth at bedtime.  Marland Kitchen levothyroxine (SYNTHROID) 125 MCG tablet Take 125 mcg by mouth daily before breakfast.   . loratadine (CLARITIN) 10 MG tablet Take 10 mg by mouth daily.  Marland Kitchen LORazepam (ATIVAN) 0.5 MG tablet Take 1 tablet (0.5 mg total) by mouth 2 (two) times daily as needed for anxiety.  . memantine (NAMENDA) 10 MG tablet Take 10 mg by mouth 2 (two) times daily.  . mirtazapine (REMERON) 30 MG tablet Take 30 mg by mouth at bedtime.  Marland Kitchen omeprazole (PRILOSEC) 20 MG capsule Take 20 mg by mouth daily.  . QUEtiapine (SEROQUEL) 100 MG tablet Take 100 mg by mouth  at bedtime.  Marland Kitchen QUEtiapine (SEROQUEL) 25 MG tablet Take 25 mg by mouth at bedtime.  . traZODone (DESYREL) 50 MG tablet Take 50 mg by mouth 2 (two) times daily.  Marland Kitchen triamcinolone cream (KENALOG) 0.1 % Apply 1 application topically See admin instructions. Apply topically 2 times daily to affected areas for 7 to 10 days, take 7 days off. May repeat  as needed. Then use twice a day as needed.   No facility-administered encounter medications on file as of 04/29/2020.     Haji Delaine Marlena Clipper, NP

## 2020-06-11 DIAGNOSIS — Z789 Other specified health status: Secondary | ICD-10-CM

## 2020-07-21 ENCOUNTER — Telehealth: Payer: Self-pay | Admitting: Adult Health Nurse Practitioner

## 2020-07-21 NOTE — Telephone Encounter (Signed)
Returned SIL's VM.  She has questions about patient undergoing extensive dental work.  Left VM with reason for call and call back info Yosselyn Tax K. Garner Nash NP

## 2020-07-24 ENCOUNTER — Telehealth: Payer: Self-pay | Admitting: Adult Health Nurse Practitioner

## 2020-07-24 NOTE — Telephone Encounter (Signed)
Returned sister in law's VM.  Patient will be having upcoming extensive oral surgery.  Discussed that with his dementia he could get some delirium associated with the anesthesia.  She will call when the surgery is scheduled and I will reach out to PCP about having something PRN in case he does experience delirium associated with the surgery.   Mattelyn Imhoff K. Garner Nash NP

## 2020-08-05 ENCOUNTER — Encounter: Payer: Self-pay | Admitting: Adult Health Nurse Practitioner

## 2020-08-05 ENCOUNTER — Non-Acute Institutional Stay: Payer: Medicare Other | Admitting: Adult Health Nurse Practitioner

## 2020-08-05 ENCOUNTER — Other Ambulatory Visit: Payer: Self-pay

## 2020-08-05 VITALS — HR 75

## 2020-08-05 DIAGNOSIS — F0280 Dementia in other diseases classified elsewhere without behavioral disturbance: Secondary | ICD-10-CM

## 2020-08-05 DIAGNOSIS — Z515 Encounter for palliative care: Secondary | ICD-10-CM

## 2020-08-05 DIAGNOSIS — G309 Alzheimer's disease, unspecified: Secondary | ICD-10-CM

## 2020-08-05 DIAGNOSIS — R059 Cough, unspecified: Secondary | ICD-10-CM

## 2020-08-05 NOTE — Progress Notes (Signed)
[facility] Palliative Care Consult Note Telephone: [phone]  Fax: [phone]    Date of encounter: 08/05/20 PATIENT NAME: Parker Chambers [id number]   [phone] (home)  DOB: [birth year] MRN: [id number] PRIMARY CARE PROVIDER:    Dr. Frazier Richards  REFERRING PROVIDER:   Dr. Frazier Richards  RESPONSIBLE PARTY:    Contact Information    Name Relation Home Work [place], [place] Spouse   [phone]   Parker Chambers Other   [phone]       I met face to face with patient in facility. Palliative Care was asked to follow this patient by consultation request of  Dr. Frazier Richards to address advance care planning and complex medical decision making. This is a follow up visit.  Will call sister in law to update on today's visit                                   ASSESSMENT AND PLAN / RECOMMENDATIONS:   Advance Care Planning/Goals of Care: Goals include to maximize quality of life and symptom management.   CODE STATUS: DNR  Symptom Management/Plan:  Dementia:  FAST 7a.  Patient is stable at this time.  He  walks with a walker but needs to be reminded often to use his walker.  Requires assistance with ADLs including feeding at times.  He is incontinent of bowel and bladder. Continue supportive care at facility  Cough: patient had cough due to bronchitis and just finished antibiotics for this.  This seems to have resolved and no cough noted with patient today   Follow up Palliative Care Visit: Palliative care will continue to follow for complex medical decision making, advance care planning, and clarification of goals. Return 8-10 weeks or prn.  I spent 30 minutes providing this consultation. More than 50% of the time in this consultation was spent in counseling and care coordination.   PPS: 40%  HOSPICE ELIGIBILITY/DIAGNOSIS: TBD  Chief Complaint: follow up palliative visit  HISTORY OF PRESENT  ILLNESS:  Parker Chambers is a 70 y.o. year old male  with dementia, hypothyroidism.Patient is unable to participate with HPI/ROS due to dementia. Patient was having cough and treated for bronchitis.  Staff does not report any ongoing cough today.  Staff have no new concerns today.  His appetite is good with no reported weight loss.    History obtained from review of EMR and interview with  facility staff and Parker Chambers.      PHYSICAL EXAM:  General: NAD, frail appearing Eyes: sclera anicteric and noninjected with no discharge noted ENMT: moist mucous membranes Cardiovascular: regular rate and rhythm Pulmonary:lung sounds clear; normal respiratory effort Abdomen: soft, nontender, + bowel sounds Extremities:traceedema of bilateralfeet, no joint deformities Skin: no rasheson exposed skin Neurological: Weakness; A&O to person and place  Thank you for the opportunity to participate in the care of Parker Chambers.  The palliative care team will continue to follow. Please call our office at [phone] if we can be of additional assistance.   Teron Blais Jenetta Downer, NP , DNP  This chart was dictated using voice recognition software. Despite best efforts to proofread, errors can occur which can change the documentation meaning.   COVID-19 PATIENT SCREENING TOOL Asked and negative response unless otherwise noted:   Have you had symptoms of covid, tested positive or been in contact with someone with symptoms/positive test  in the past 5-10 days? negative

## 2020-08-06 ENCOUNTER — Telehealth: Payer: Self-pay | Admitting: Adult Health Nurse Practitioner

## 2020-08-06 NOTE — Telephone Encounter (Signed)
Called sister in law to update on yesterday's visit.  Left VM with reason for call and contact info Kerissa Coia K. Garner Nash NP

## 2020-09-17 ENCOUNTER — Telehealth: Payer: Self-pay | Admitting: Adult Health Nurse Practitioner

## 2020-09-17 NOTE — Telephone Encounter (Signed)
Returned sister in laws VM about delay in dental referral.  Left VM with reason for call and call back info.  Will reach out to SW at facility to see if she has any insight on the dental referral Parker Clowdus K. Parker Nash NP

## 2020-09-18 ENCOUNTER — Telehealth: Payer: Self-pay | Admitting: Adult Health Nurse Practitioner

## 2020-09-18 NOTE — Telephone Encounter (Signed)
This is a late entry.  Returned sister-in-law's voice message yesterday inquiring about referral for oral surgeon.  Left voice message with reason for call and callback info.  Have reached out to social worker at facility and was informed that sister-in-law has been informed that she needed to contact the oral surgeon herself. Chantal Worthey K. Garner Nash, NP

## 2020-11-04 ENCOUNTER — Other Ambulatory Visit: Payer: Self-pay

## 2020-11-04 ENCOUNTER — Non-Acute Institutional Stay: Payer: Medicare Other | Admitting: Student

## 2020-11-04 DIAGNOSIS — F0280 Dementia in other diseases classified elsewhere without behavioral disturbance: Secondary | ICD-10-CM

## 2020-11-04 DIAGNOSIS — G309 Alzheimer's disease, unspecified: Secondary | ICD-10-CM

## 2020-11-04 DIAGNOSIS — Z515 Encounter for palliative care: Secondary | ICD-10-CM

## 2020-11-04 NOTE — Progress Notes (Signed)
[facility] Consult Note Telephone: [phone]  Fax: [phone]    Date of encounter: 11/04/20 PATIENT NAME: Parker Chambers [id number]   [phone] (home)  DOB: November 13, 1950 MRN: [id number] PRIMARY CARE PROVIDER:    Dr. Frazier Richards  REFERRING PROVIDER:   Dr. Frazier Richards  RESPONSIBLE PARTY:    Contact Information     Name Relation Home Work [place], [place] Spouse   [phone]   Laurie Panda Other   [phone]        I met face to face with patient in the facility. Palliative Care was asked to follow this patient by consultation request of  Dr. Ouida Sills to address advance care planning and complex medical decision making. This is a follow up visit.   Palliative NP will call family to provide an update on today's visit.                                   ASSESSMENT AND PLAN / RECOMMENDATIONS:   Advance Care Planning/Goals of Care: Goals include to maximize quality of life and symptom management.   CODE STATUS: DNR  Symptom Management/Plan:  Alzheimer's dementia-FAST score 7b. Patient requires assistance with adl's. Staff to continue to provide supportive care. Monitor for aspiration; assist with feeding. Continue memantine, donepezil as directed.   Follow up Palliative Care Visit: Palliative care will continue to follow for complex medical decision making, advance care planning, and clarification of goals. Return in 8  weeks or prn.  I spent 25 minutes providing this consultation. More than 50% of the time in this consultation was spent in counseling and care coordination.   PPS: 40%  HOSPICE ELIGIBILITY/DIAGNOSIS: TBD  Chief Complaint: Palliative Medicine follow up visit.   HISTORY OF PRESENT ILLNESS:  Parker Chambers is a 70 y.o. year old male  with Alzheimer's dementia, hypothyroidism, anxiety, depression, hypertension.  Patient resides at [facility] at  [facility]. Staff report patient being stable. Staff states he does require more assistance with feeding. Good appetite endorsed. Receives mechanical soft diet, chopped meats. He does have occasional cough; nurse states it is worse when he has dairy products. He has been evaluated by speech therapy. He does require assistance with all adl's. He ambulates short distances at times. Bowel and bladder incontinence. No recent falls or injury reported. His behaviors have been managed with Seroquel. Nurse states he can become agitated depending on how he is approached. His speech is limited, speaks only a few intelligible words. No recent infections; no recent ER visits or hospitalizations.   History obtained from review of EMR, discussion with primary team, and interview with family, facility staff/caregiver and/or Parker Chambers.  I reviewed available labs, medications, imaging, studies and related documents from the EMR.  Records reviewed and summarized above.   ROS  Patient unable to contribute due to impaired cognition.  Physical Exam: Weight: 235 pounds 10/2020 Constitutional: NAD General: frail appearing EYES: anicteric sclera, lids intact, no discharge  ENMT: intact hearing, oral mucous membranes moist, dentition intact CV: S1S2, RRR, no LE edema Pulmonary: LCTA, no increased work of breathing, no cough, room air Abdomen: normo-active BS + 4 quadrants, soft and non tender GU: deferred MSK: no sarcopenia, moves all extremities Skin: warm and dry, no rashes or wounds on visible skin Neuro: generalized weakness, Alert & O to person Psych: non-anxious affect, pleasant Hem/lymph/immuno: no  widespread bruising   Thank you for the opportunity to participate in the care of Parker Chambers.  The palliative care team will continue to follow. Please call our office at [phone] if we can be of additional assistance.   Ezekiel Slocumb, NP   COVID-19 PATIENT SCREENING TOOL Asked and negative  response unless otherwise noted:   Have you had symptoms of covid, tested positive or been in contact with someone with symptoms/positive test in the past 5-10 days? No

## 2021-01-08 ENCOUNTER — Non-Acute Institutional Stay: Payer: Medicare Other | Admitting: Student

## 2021-01-08 ENCOUNTER — Other Ambulatory Visit: Payer: Self-pay

## 2021-01-08 DIAGNOSIS — F02811 Dementia in other diseases classified elsewhere, unspecified severity, with agitation: Secondary | ICD-10-CM

## 2021-01-08 DIAGNOSIS — Z515 Encounter for palliative care: Secondary | ICD-10-CM

## 2021-01-08 NOTE — Progress Notes (Signed)
[facility] Consult Note Telephone: [phone]  Fax: [phone]    Date of encounter: 01/08/21 1:30 PM PATIENT NAME: Parker Chambers [id number]   [phone] (home)  DOB: Aug 29, 1950 MRN: [id number] PRIMARY CARE PROVIDER:    Dr. Frazier Richards  REFERRING PROVIDER:   Dr. Frazier Richards  RESPONSIBLE PARTY:    Contact Information     Name Relation Home Work [place], [place] Spouse   [phone]   Laurie Panda Other   [phone]        I met face to face with patient in the facility. Palliative Care was asked to follow this patient by consultation request of  Dr. Ouida Sills to address advance care planning and complex medical decision making. This is a follow up visit.                                   ASSESSMENT AND PLAN / RECOMMENDATIONS:   Advance Care Planning/Goals of Care: Goals include to maximize quality of life and symptom management.  CODE STATUS: DNR  Symptom Management/Plan:  Alzheimer's dementia-FAST score 7b. Occasional agitation. Patient requires assistance with adl's. Staff to continue to provide supportive care. Monitor for aspiration; he is requiring more assistance with feeding. Continue memantine, donepezil, quetiapine and mirtazapine as directed. Monitor for falls/safety.   Follow up Palliative Care Visit: Palliative care will continue to follow for complex medical decision making, advance care planning, and clarification of goals. Return in 8 weeks or prn.  I spent 25 minutes providing this consultation. More than 50% of the time in this consultation was spent in counseling and care coordination.    PPS: 40%  HOSPICE ELIGIBILITY/DIAGNOSIS: TBD  Chief Complaint: Palliative Medicine follow up visit.   HISTORY OF PRESENT ILLNESS:  Parker Chambers is a 70 y.o. year old male  with Alzheimer's dementia, hypothyroidism, anxiety, depression, hypertension.    Patient resides at [facility]. Staff report patient being stable. Staff states he does require more assistance with feeding. Good appetite endorsed. Receives mechanical soft diet, chopped meats. Requiring more assistance with transfers. Mechanical lift being used more for transfers. Will take few steps at times. No recent falls reported. Occasional agitation; receives Seroquel with effectiveness. No recent infections; no recent ER visits or hospitalizations.   History obtained from review of EMR, discussion with primary team, and interview with family, facility staff/caregiver and/or Parker Chambers.  I reviewed available labs, medications, imaging, studies and related documents from the EMR.  Records reviewed and summarized above.   ROS  Patient unable to contribute due to impaired cognition.  Physical Exam: Weight: 238 pounds Pulse 92, resp 16, b/p 120/76 Constitutional: NAD General: frail appearing EYES: anicteric sclera, lids intact, no discharge  ENMT: intact hearing, oral mucous membranes moist, dentition intact CV: S1S2, RRR, trace LE edema Pulmonary: LCTA, no increased work of breathing, no cough Abdomen: normo-active BS + 4 quadrants, soft and non tender GU: deferred MSK: no sarcopenia, moves all extremities Skin: warm and dry, no rashes or wounds on visible skin Neuro: generalized weakness, Alert and oriented to person Psych: non-anxious affect, pleasant Hem/lymph/immuno: no widespread bruising   Thank you for the opportunity to participate in the care of Parker Chambers.  The palliative care team will continue to follow. Please call our office at [phone] if we can be of additional assistance.   Philip Aspen  Ziyanna Tolin, NP   COVID-19 PATIENT SCREENING TOOL Asked and negative response unless otherwise noted:   Have you had symptoms of covid, tested positive or been in contact with someone with symptoms/positive test in the past 5-10 days? No

## 2021-03-15 ENCOUNTER — Non-Acute Institutional Stay: Payer: Medicare Other | Admitting: Student

## 2021-03-15 ENCOUNTER — Other Ambulatory Visit: Payer: Self-pay

## 2021-03-15 DIAGNOSIS — G308 Other Alzheimer's disease: Secondary | ICD-10-CM

## 2021-03-15 DIAGNOSIS — Z515 Encounter for palliative care: Secondary | ICD-10-CM

## 2021-03-15 NOTE — Progress Notes (Signed)
[facility] Consult Note Telephone: [phone]  Fax: [phone]    Date of encounter: 03/15/21 1:04 PM PATIENT NAME: Parker Chambers [id number]   [phone] (home)  DOB: [birth year] MRN: [id number] PRIMARY CARE PROVIDER:    Dr. Feliciana Rossetti PROVIDER:   Dr. Ouida Sills  RESPONSIBLE PARTY:    Contact Information     Name Relation Home Work [place], [place]   [phone]   Laurie Panda Other   [phone]        I met face to face with patient in the facility. Palliative Care was asked to follow this patient by consultation request of  Dr. Ouida Sills to address advance care planning and complex medical decision making. This is a follow up visit.                                   ASSESSMENT AND PLAN / RECOMMENDATIONS:   Advance Care Planning/Goals of Care: Goals include to maximize quality of life and symptom management.  CODE STATUS: DNR  Symptom Management/Plan:  Alzheimer's dementia-FAST score 7b. Occasional agitation. Continue donepezil, memantine, mirtazapine, quetiapine, trazodone as directed.Patient requires assistance with adl's. Staff to continue to provide supportive care. Monitor for aspiration; staff to continue feeding. Continue mechanical soft diet with thin liquids.   Follow up Palliative Care Visit: Palliative care will continue to follow for complex medical decision making, advance care planning, and clarification of goals. Return in  weeks or prn.  I spent 15 minutes providing this consultation. More than 50% of the time in this consultation was spent in counseling and care coordination.   PPS: 40%  HOSPICE ELIGIBILITY/DIAGNOSIS: TBD  Chief Complaint: Palliative Medicine follow up visit.   HISTORY OF PRESENT ILLNESS:  Parker Chambers is a 70 y.o. year old male  with Alzheimer's dementia, hypothyroidism, anxiety, depression, hypertension.    Patient resides  at [facility]. Staff deny any acute changes or declines. He does continue to aspirate occasionally. He is fed by staff; occasional coughing. Aspiration precautions are in place. Continues to have good appetite; weight stable. He is dependent for all adl's. Mechanical lift being used for transfers. Occasional agitation per staff. No recent infections; no recent ER visits or hospitalizations. HPI and ROS obtained per staff due to his advanced dementia.  History obtained from review of EMR, discussion with primary team, and interview with family, facility staff/caregiver and/or Parker Chambers.  I reviewed available labs, medications, imaging, studies and related documents from the EMR.  Records reviewed and summarized above.   ROS  Unable to obtain from patient due to his dementia.   Physical Exam: Weight: 234.7 pounds Pulse 78, resp 16, b/p 136/88, sats 94% on room air Constitutional: NAD General: frail appearing EYES: anicteric sclera, lids intact, no discharge  ENMT: intact hearing, oral mucous membranes moist CV: S1S2, RRR, no LE edema Pulmonary: LCTA, no increased work of breathing, no cough, room air Abdomen: normo-active BS + 4 quadrants, soft and non tender, no ascites GU: deferred MSK: non-ambulatory Skin: warm and dry, no rashes or wounds on visible skin Neuro:  no generalized weakness, A & O to person, speech mostly nonsensical, speaks few words Psych: non-anxious affect, pleasant Hem/lymph/immuno: no widespread bruising   Thank you for the opportunity to participate in the care of Parker Chambers.  The palliative care team will continue to  follow. Please call our office at [phone] if we can be of additional assistance.   Ezekiel Slocumb, NP   COVID-19 PATIENT SCREENING TOOL Asked and negative response unless otherwise noted:   Have you had symptoms of covid, tested positive or been in contact with someone with symptoms/positive test in the past 5-10  days?  No

## 2021-05-14 ENCOUNTER — Other Ambulatory Visit: Payer: Self-pay

## 2021-05-14 ENCOUNTER — Non-Acute Institutional Stay: Payer: Medicare Other | Admitting: Student

## 2021-05-14 DIAGNOSIS — Z515 Encounter for palliative care: Secondary | ICD-10-CM

## 2021-05-14 DIAGNOSIS — R630 Anorexia: Secondary | ICD-10-CM

## 2021-05-14 DIAGNOSIS — G308 Other Alzheimer's disease: Secondary | ICD-10-CM

## 2021-05-14 DIAGNOSIS — R1310 Dysphagia, unspecified: Secondary | ICD-10-CM

## 2021-05-14 DIAGNOSIS — F02811 Dementia in other diseases classified elsewhere, unspecified severity, with agitation: Secondary | ICD-10-CM

## 2021-05-14 NOTE — Progress Notes (Signed)
[facility] Consult Note Telephone: [phone]  Fax: [phone]    Date of encounter: 05/14/21  PATIENT NAME: Parker Chambers [address] [place] [address]   [phone] (home)  DOB: 11-15-1950 MRN: [id number] PRIMARY CARE PROVIDER:    Dr. Feliciana Rossetti PROVIDER:   Dr. Ouida Sills  RESPONSIBLE PARTY:    Contact Information     Name Relation Home Work [place], [place]   [phone]   Laurie Panda Other   [phone]        I met face to face with patient and family in the facility. Palliative Care was asked to follow this patient by consultation request of Dr. Ouida Sills to address advance care planning and complex medical decision making. This is a follow up visit.  Will follow up with family to provide update on today's visit.                                    ASSESSMENT AND PLAN / RECOMMENDATIONS:   Advance Care Planning/Goals of Care: Goals include to maximize quality of life and symptom management. Patient/health care surrogate gave his/her permission to discuss.  CODE STATUS: DNR  Symptom Management/Plan:  Alzheimer's dementia-FAST score 7b. Continue donepezil, memantine, mirtazapine, quetiapine, trazodone as directed.Patient requires assistance with adl's. Staff to continue to provide supportive care; staff to anticipate his care needs. Agitation-no worsening agitation or behaviors reported; continue medications above as directed.   Dysphagia-no recent episodes of aspiration reported. Monitor for aspiration; staff to continue feeding. Continue mechanical soft diet , chopped meats with thin liquids. Aspiration precautions in place.   Decline in appetite-patient with decline in appetite; likely related to oral surgery in January. Appetite appears to be improving and back to baseline. Routine weights per facility policy. Will monitor for changes in appetite and weight loss.   Follow up  Palliative Care Visit: Palliative care will continue to follow for complex medical decision making, advance care planning, and clarification of goals. Return in 8 weeks or prn.   This visit was coded based on medical decision making (MDM).   PPS: 40%  HOSPICE ELIGIBILITY/DIAGNOSIS: TBD  Chief Complaint: Palliative medicine follow-up visit.  HISTORY OF PRESENT ILLNESS:  Parker Chambers is a 71 y.o. year old male  with  Alzheimer's dementia, hypothyroidism, anxiety, depression, hypertension.   Patient resides at the [facility].  Staff report patient being stable; no acute changes.  He does require assistance with all ADLs. He is fed by staff, occasional coughing noted. No worsening of cough. He does have some weight loss noted; in the past month, likely related to oral surgery. His appetite has improved some since have oral surgery in January. He is eating 50-100% of meals.  Mechanical lift is used for transfers, staff report he can occasionally take 1-2 steps.  No worsening agitation reported per staff.  HPI and ROS obtained per staff due to his advanced dementia.  History obtained from review of EMR, discussion with primary team, and interview with family, facility staff/caregiver and/or [name].  I reviewed available labs, medications, imaging, studies and related documents from the EMR.  Records reviewed and summarized above.    Physical Exam: Weight: 224.8 pounds 04/2021; 230 pounds 03/2021; 234 pounds 02/2021. Constitutional: NAD General: frail appearing EYES: anicteric sclera, lids intact, no discharge  ENMT: intact hearing, oral mucous membranes moist, dentition intact CV: S1S2,  RRR, no LE edema Pulmonary: LCTA, no increased work of breathing, no cough, room air Abdomen: normo-active BS + 4 quadrants, soft and non tender, no ascites GU: deferred MSK:  moves all extremities Skin: warm and dry, no rashes or wounds on visible skin Neuro: generalized weakness,  alert and oriented to person, speaks few words Psych: non-anxious affect Hem/lymph/immuno: no widespread bruising   Thank you for the opportunity to participate in the care of [name].  The palliative care team will continue to follow. Please call our office at [phone] if we can be of additional assistance.   Ezekiel Slocumb, NP   COVID-19 PATIENT SCREENING TOOL Asked and negative response unless otherwise noted:   Have you had symptoms of covid, tested positive or been in contact with someone with symptoms/positive test in the past 5-10 days? No

## 2021-05-17 ENCOUNTER — Telehealth: Payer: Self-pay | Admitting: Student

## 2021-05-17 NOTE — Telephone Encounter (Signed)
Message left for patient's daughter to provide an update on palliative medicine visit; awaiting return call.

## 2021-06-11 ENCOUNTER — Non-Acute Institutional Stay: Payer: Medicare Other | Admitting: Student

## 2021-06-11 ENCOUNTER — Other Ambulatory Visit: Payer: Self-pay

## 2021-06-11 DIAGNOSIS — Z515 Encounter for palliative care: Secondary | ICD-10-CM

## 2021-06-11 DIAGNOSIS — R1310 Dysphagia, unspecified: Secondary | ICD-10-CM

## 2021-06-11 DIAGNOSIS — R630 Anorexia: Secondary | ICD-10-CM

## 2021-06-11 DIAGNOSIS — F02811 Dementia in other diseases classified elsewhere, unspecified severity, with agitation: Secondary | ICD-10-CM

## 2021-06-11 NOTE — Progress Notes (Signed)
? ? ?[facility] ?[facility] Consult Note ?Telephone: [phone]  ?Fax: [phone]  ? ? ?Date of encounter: 06/11/21 ?11:29 AM ?PATIENT NAME: Parker Chambers ?[place] [address]   ?[phone] (home)  ?DOB: [birth year] ?MRN: [id number] ?PRIMARY CARE PROVIDER:    ?Dr. Ouida Sills ? ?REFERRING PROVIDER:   ?Dr. Ouida Sills ? ?RESPONSIBLE PARTY:    ?Contact Information   ? ? Name Relation Home Work [place]  ? [place], [place]   [phone]  ? Laurie Panda Other   [phone]  ? ?  ? ? ? ?I met face to face with patient in the facility. Palliative Care was asked to follow this patient by consultation request of  Dr. Ouida Sills to address advance care planning and complex medical decision making. This is a follow up visit. ? ?Left message for patient's sister in law regarding today's visit.  ? ?                                 ASSESSMENT AND PLAN / RECOMMENDATIONS:  ? ?Advance Care Planning/Goals of Care: Goals include to maximize quality of life and symptom management. Patient/health care surrogate gave his/her permission to discuss. ?CODE STATUS: DNR ? ?Symptom Management/Plan: ? ?Alzheimer's dementia-FAST score 7b. Continue donepezil, memantine, mirtazapine, quetiapine, trazodone as directed. Patient requires assistance with adl's. Staff to continue to provide supportive care; staff to anticipate his care needs. Family to speak with facility SW regarding restarting therapy. Agitation-no worsening agitation or behaviors reported; continue medications above as directed.  ? ?Dysphagia-no recent episodes of aspiration reported. Monitor for aspiration; staff to continue feeding. Continue mechanical soft diet , chopped meats with thin liquids. Aspiration precautions in place.  ? ?Appetite-patient's appetite is back to baseline; he is eating 75-100% of meals and has had actual weight gain. Continue routine weights per facility.  ? ?Follow up Palliative Care Visit: Palliative  care will continue to follow for complex medical decision making, advance care planning, and clarification of goals. Return in 8 weeks or prn. ? ?This visit was coded based on medical decision making (MDM). ? ?PPS: 40% ? ?HOSPICE ELIGIBILITY/DIAGNOSIS: TBD ? ?Chief Complaint: Palliative Medicine follow up visit.  ? ?HISTORY OF PRESENT ILLNESS:  Parker Chambers is a 71 y.o. year old male  with Alzheimer's dementia, hypothyroidism, anxiety, depression, hypertension.  ? ?[facility]  Staff report patient being stable; no acute changes. Staff report using mechanical lift for transfers mostly, although he will occasionally ambulate with certain staff members. No recent falls reported. Good appetite endorsed; he is monitored for aspiration. No worsening behaviors. HPI and ROS obtained per staff due to his advanced dementia. ? ?History obtained from review of EMR, discussion with primary team, and interview with family, facility staff/caregiver and/or Mr. Henard.  ?I reviewed available labs, medications, imaging, studies and related documents from the EMR.  Records reviewed and summarized above.  ? ? ?Physical Exam: ?Weight: 236 pounds ?Constitutional: NAD ?General: frail appearing  ?EYES: anicteric sclera, lids intact, no discharge  ?ENMT: intact hearing, oral mucous membranes moist ?CV: S1S2, RRR, no LE edema ?Pulmonary: LCTA, no increased work of breathing, no cough, room air ?Abdomen: normo-active BS + 4 quadrants, soft and non tender, no ascites ?GU: deferred ?MSK: no sarcopenia, moves all extremities ?Skin: warm and dry, no rashes or wounds on visible skin ?Neuro: generalized weakness, alert and oriented to person ?Psych: non-anxious affect ?Hem/lymph/immuno: no  widespread bruising ? ? ?Thank you for the opportunity to participate in the care of Mr. Storlie.  The palliative care team will continue to follow. Please call our office at [phone] if we can be of additional  assistance.  ? ?Ezekiel Slocumb, NP  ? ?COVID-19 PATIENT SCREENING TOOL ?Asked and negative response unless otherwise noted:  ? ?Have you had symptoms of covid, tested positive or been in contact with someone with symptoms/positive test in the past 5-10 days? No ? ?

## 2021-08-12 ENCOUNTER — Non-Acute Institutional Stay: Payer: Medicare Other | Admitting: Student

## 2021-08-12 DIAGNOSIS — Z515 Encounter for palliative care: Secondary | ICD-10-CM

## 2021-08-12 DIAGNOSIS — R1310 Dysphagia, unspecified: Secondary | ICD-10-CM

## 2021-08-12 DIAGNOSIS — G308 Other Alzheimer's disease: Secondary | ICD-10-CM

## 2021-08-12 NOTE — Progress Notes (Signed)
[facility] Palliative Care Consult Note Telephone: [phone]  Fax: [phone]    Date of encounter: 08/12/21 9:45 PM PATIENT NAME: Parker Chambers [id number]   [phone] (home)  DOB: 1951-03-04 MRN: [id number] PRIMARY CARE PROVIDER:    Dr. Feliciana Rossetti PROVIDER:   Dr. Ouida Sills  RESPONSIBLE PARTY:    Contact Information     Name Relation Home Work [place], [place]   [phone]   Laurie Panda Other   [phone]        I met face to face with patient in the facility. Palliative Care was asked to follow this patient by consultation request of  Dr. Ouida Sills to address advance care planning and complex medical decision making. This is a follow up visit.                                   ASSESSMENT AND PLAN / RECOMMENDATIONS:   Advance Care Planning/Goals of Care: Goals include to maximize quality of life and symptom management. Patient/health care surrogate gave his/her permission to discuss.  CODE STATUS: DNR  Symptom Management/Plan:  Alzheimer's dementia-FAST score 7b Continue donepezil, memantine, mirtazapine, quetiapine, trazodone as directed. Patient requires assistance with adl's. Mechanical lift used for transfers, although patient will sometimes ambulate per staff. Staff to continue to provide supportive care; staff to anticipate his care needs.  Agitation-behaviors have been stable. No worsening agitation or behaviors reported; continue medications above as directed  Dysphagia-patient goes through periods where he coughs more when eating or drinking. Continue mechanical soft diet , chopped meats with thin liquids. Aspiration precautions in place; staff to assist with feeding.   Follow up Palliative Care Visit: Palliative care will continue to follow for complex medical decision making, advance care planning, and clarification of goals. Return in 8 weeks or prn.   This visit  was coded based on medical decision making (MDM).  PPS: 40%  HOSPICE ELIGIBILITY/DIAGNOSIS: TBD  Chief Complaint: Palliative Medicine follow up visit.   HISTORY OF PRESENT ILLNESS:  [name] is a 71 y.o. year old male  with Alzheimer's dementia, hypothyroidism, anxiety, depression, hypertension.    Patient resides at the [facility]. Patient is out of bed daily to w/c or recliner. Sit to stand lift used for transfers. He will still occasionally ambulate. No recent falls reported. Good appetite; he will occasionally cough when eating or drinking; receiving mechanical soft diet.   Patient received sitting in recliner. He speaks 1-2 words today. When asked how was lunch today, he states "good." PAINAD-0.  HPI and ROS obtained primarily from staff due to his advanced dementia.  History obtained from review of EMR, discussion with primary team, and interview with family, facility staff/caregiver and/or Mr. Marcell.  I reviewed available labs, medications, imaging, studies and related documents from the EMR.  Records reviewed and summarized above.   Physical Exam: Weight: 233. 3 pounds Constitutional: NAD General: frail appearing EYES: anicteric sclera, lids intact, no discharge  ENMT: intact hearing, oral mucous membranes moist, dentition intact CV: S1S2, RRR, no LE edema Pulmonary: LCTA, no increased work of breathing, no cough, room air Abdomen:  normo-active BS + 4 quadrants, soft and non tender, no ascites GU: deferred MSK: no sarcopenia, moves all extremities Skin: warm and dry, no rashes or wounds on visible skin Neuro: generalized weakness, alert, oriented to person Psych: non-anxious affect, pleasant  Hem/lymph/immuno: no widespread bruising   Thank you for the opportunity to participate in the care of Mr. Misuraca.  The palliative care team will continue to follow. Please call our office at [phone] if we can be of additional assistance.   Ezekiel Slocumb, NP   COVID-19 PATIENT SCREENING TOOL Asked and negative response unless otherwise noted:   Have you had symptoms of covid, tested positive or been in contact with someone with symptoms/positive test in the past 5-10 days? No

## 2021-08-24 DIAGNOSIS — I10 Essential (primary) hypertension: Secondary | ICD-10-CM | POA: Diagnosis present

## 2021-09-04 ENCOUNTER — Emergency Department: Payer: Medicare Other

## 2021-09-04 ENCOUNTER — Inpatient Hospital Stay
Admission: EM | Admit: 2021-09-04 | Discharge: 2021-09-09 | DRG: 871 | Disposition: A | Discharge status: Skilled Nursing Facility | Payer: Medicare Other | Source: Skilled Nursing Facility | Attending: Hospitalist | Admitting: Hospitalist

## 2021-09-04 ENCOUNTER — Other Ambulatory Visit: Payer: Self-pay

## 2021-09-04 DIAGNOSIS — J9601 Acute respiratory failure with hypoxia: Secondary | ICD-10-CM | POA: Diagnosis present

## 2021-09-04 DIAGNOSIS — A419 Sepsis, unspecified organism: Principal | ICD-10-CM | POA: Diagnosis present

## 2021-09-04 DIAGNOSIS — F0390 Unspecified dementia without behavioral disturbance: Secondary | ICD-10-CM | POA: Diagnosis present

## 2021-09-04 DIAGNOSIS — R4182 Altered mental status, unspecified: Secondary | ICD-10-CM | POA: Diagnosis present

## 2021-09-04 DIAGNOSIS — Z79899 Other long term (current) drug therapy: Secondary | ICD-10-CM

## 2021-09-04 DIAGNOSIS — I10 Essential (primary) hypertension: Secondary | ICD-10-CM | POA: Diagnosis present

## 2021-09-04 DIAGNOSIS — Z800 Family history of malignant neoplasm of digestive organs: Secondary | ICD-10-CM

## 2021-09-04 DIAGNOSIS — Z87891 Personal history of nicotine dependence: Secondary | ICD-10-CM

## 2021-09-04 DIAGNOSIS — Z79890 Hormone replacement therapy (postmenopausal): Secondary | ICD-10-CM | POA: Diagnosis not present

## 2021-09-04 DIAGNOSIS — Z8041 Family history of malignant neoplasm of ovary: Secondary | ICD-10-CM | POA: Diagnosis not present

## 2021-09-04 DIAGNOSIS — E039 Hypothyroidism, unspecified: Secondary | ICD-10-CM | POA: Diagnosis present

## 2021-09-04 DIAGNOSIS — R1110 Vomiting, unspecified: Secondary | ICD-10-CM | POA: Diagnosis present

## 2021-09-04 DIAGNOSIS — Z66 Do not resuscitate: Secondary | ICD-10-CM | POA: Diagnosis present

## 2021-09-04 DIAGNOSIS — E876 Hypokalemia: Secondary | ICD-10-CM | POA: Diagnosis present

## 2021-09-04 DIAGNOSIS — J690 Pneumonitis due to inhalation of food and vomit: Secondary | ICD-10-CM | POA: Diagnosis present

## 2021-09-04 DIAGNOSIS — Z789 Other specified health status: Secondary | ICD-10-CM

## 2021-09-04 DIAGNOSIS — J189 Pneumonia, unspecified organism: Secondary | ICD-10-CM | POA: Diagnosis present

## 2021-09-04 DIAGNOSIS — Z1152 Encounter for screening for COVID-19: Secondary | ICD-10-CM

## 2021-09-04 DIAGNOSIS — B9789 Other viral agents as the cause of diseases classified elsewhere: Secondary | ICD-10-CM | POA: Diagnosis present

## 2021-09-04 DIAGNOSIS — B348 Other viral infections of unspecified site: Secondary | ICD-10-CM | POA: Insufficient documentation

## 2021-09-04 DIAGNOSIS — R509 Fever, unspecified: Secondary | ICD-10-CM

## 2021-09-04 LAB — CBC WITH DIFFERENTIAL/PLATELET
Abs Immature Granulocytes: 0.04 10*3/uL (ref 0.00–0.07)
Basophils Absolute: 0 10*3/uL (ref 0.0–0.1)
Basophils Relative: 0 %
Eosinophils Absolute: 0 10*3/uL (ref 0.0–0.5)
Eosinophils Relative: 0 %
HCT: 41.4 % (ref 39.0–52.0)
Hemoglobin: 13.2 g/dL (ref 13.0–17.0)
Immature Granulocytes: 0 %
Lymphocytes Relative: 4 %
Lymphs Abs: 0.5 10*3/uL — ABNORMAL LOW (ref 0.7–4.0)
MCH: 28.4 pg (ref 26.0–34.0)
MCHC: 31.9 g/dL (ref 30.0–36.0)
MCV: 89.2 fL (ref 80.0–100.0)
Monocytes Absolute: 1.1 10*3/uL — ABNORMAL HIGH (ref 0.1–1.0)
Monocytes Relative: 10 %
Neutro Abs: 9.1 10*3/uL — ABNORMAL HIGH (ref 1.7–7.7)
Neutrophils Relative %: 86 %
Platelets: 218 10*3/uL (ref 150–400)
RBC: 4.64 MIL/uL (ref 4.22–5.81)
RDW: 13.1 % (ref 11.5–15.5)
WBC: 10.7 10*3/uL — ABNORMAL HIGH (ref 4.0–10.5)
nRBC: 0 % (ref 0.0–0.2)

## 2021-09-04 LAB — SARS CORONAVIRUS 2 BY RT PCR: SARS Coronavirus 2 by RT PCR: NEGATIVE

## 2021-09-04 LAB — URINALYSIS, ROUTINE W REFLEX MICROSCOPIC
Bilirubin Urine: NEGATIVE
Glucose, UA: NEGATIVE mg/dL
Hgb urine dipstick: NEGATIVE
Ketones, ur: NEGATIVE mg/dL
Leukocytes,Ua: NEGATIVE
Nitrite: NEGATIVE
Protein, ur: NEGATIVE mg/dL
Specific Gravity, Urine: 1.008 (ref 1.005–1.030)
pH: 6 (ref 5.0–8.0)

## 2021-09-04 LAB — LIPASE, BLOOD: Lipase: 38 U/L (ref 11–51)

## 2021-09-04 LAB — COMPREHENSIVE METABOLIC PANEL
ALT: 14 U/L (ref 0–44)
AST: 16 U/L (ref 15–41)
Albumin: 3.2 g/dL — ABNORMAL LOW (ref 3.5–5.0)
Alkaline Phosphatase: 65 U/L (ref 38–126)
Anion gap: 7 (ref 5–15)
BUN: 15 mg/dL (ref 8–23)
CO2: 27 mmol/L (ref 22–32)
Calcium: 8 mg/dL — ABNORMAL LOW (ref 8.9–10.3)
Chloride: 107 mmol/L (ref 98–111)
Creatinine, Ser: 1.01 mg/dL (ref 0.61–1.24)
GFR, Estimated: >60 mL/min (ref >60)
Glucose, Bld: 123 mg/dL — ABNORMAL HIGH (ref 70–99)
Potassium: 3.2 mmol/L — ABNORMAL LOW (ref 3.5–5.1)
Sodium: 141 mmol/L (ref 135–145)
Total Bilirubin: 0.7 mg/dL (ref 0.3–1.2)
Total Protein: 5.8 g/dL — ABNORMAL LOW (ref 6.5–8.1)

## 2021-09-04 LAB — LACTIC ACID, PLASMA: Lactic Acid, Venous: 1 mmol/L (ref 0.5–1.9)

## 2021-09-04 LAB — TROPONIN I (HIGH SENSITIVITY): Troponin I (High Sensitivity): 10 ng/L (ref <18)

## 2021-09-04 MED ORDER — SODIUM CHLORIDE 0.9 % IV SOLN
3.0000 g | Freq: Once | INTRAVENOUS | Status: AC
  Administered 2021-09-04: 3 g via INTRAVENOUS
  Filled 2021-09-04: qty 8

## 2021-09-04 MED ORDER — LACTATED RINGERS IV BOLUS
1000.0000 mL | Freq: Once | INTRAVENOUS | Status: AC
  Administered 2021-09-04: 1000 mL via INTRAVENOUS

## 2021-09-04 MED ORDER — ONDANSETRON HCL 4 MG/2ML IJ SOLN
4.0000 mg | Freq: Once | INTRAMUSCULAR | Status: AC
  Administered 2021-09-04: 4 mg via INTRAVENOUS
  Filled 2021-09-04: qty 2

## 2021-09-04 NOTE — Code Documentation (Signed)
CODE SEPSIS - PHARMACY COMMUNICATION  **Broad Spectrum Antibiotics should be administered within 1 hour of Sepsis diagnosis**  Time Code Sepsis Called/Page Received: 2055  Antibiotics Ordered: Unasyn  Time of 1st antibiotic administration: 2121  Selinda Eon ,PharmD Clinical Pharmacist  09/04/2021  9:09 PM

## 2021-09-04 NOTE — Assessment & Plan Note (Addendum)
--  started on Unasyn --passed SLP eval today, but may have silent aspiration during sleep.  SLP also noted frequent regurgitation of likely stomach content. Plan: --cont Unasyn --increase PPI to BID

## 2021-09-04 NOTE — ED Triage Notes (Signed)
Pt arrives from [facility] with DNR in place, "unresponsive" to all but painful stimuli per ems, pt with rhonchi noted, vomiting enroute per ems and at facility today. Pt febrile at facility, tylenol given at 1930 at facility. Md at bedside.

## 2021-09-04 NOTE — Assessment & Plan Note (Signed)
Continue Synthroid °

## 2021-09-04 NOTE — Assessment & Plan Note (Addendum)
Fever, tachycardia.  Suspect primary diagnosis is aspiration pneumonia

## 2021-09-04 NOTE — Assessment & Plan Note (Addendum)
Different from baseline according to the folks at the SNF Likely related to sepsis and aspiration. --per RN, currently not safe for oral intake Plan: --NS@50  for 20 hours

## 2021-09-04 NOTE — Assessment & Plan Note (Signed)
Continue Norvasc

## 2021-09-04 NOTE — Sepsis Progress Note (Signed)
Elink following code sepsis °

## 2021-09-04 NOTE — ED Provider Notes (Signed)
[facility] Provider Note    Event Date/Time   First MD Initiated Contact with Patient 09/04/21 2051     (approximate)   History   Chief Complaint Aspiration and Vomiting   HPI  Parker Chambers is a 71 y.o. male with past medical history of hypertension, hypothyroidism, dementia, and aspiration who presents to the ED complaining of altered mental status.  Per EMS, patient noted to be minimally responsive at his nursing facility.  They were told that at baseline patient is able to open his eyes and interact, but does not verbally communicate.  Patient then found by staff today to be less responsive than usual, not opening his eyes to voice.  He was found to have a fever of 102.1 and was given a Tylenol suppository, subsequently vomited twice with EMS.  EMS found room air oxygen saturation to be 88%, he was subsequently placed on 4 L nasal cannula with improvement.  On arrival to the ED, patient does not open his eyes to voice but responds to painful stimuli.  EMS states that patient has a DNR in place.     Physical Exam   Triage Vital Signs: ED Triage Vitals  Enc Vitals Group     BP 09/04/21 2051 130/66     Pulse Rate 09/04/21 2051 (!) 118     Resp 09/04/21 2051 (!) 36     Temp --      Temp Source 09/04/21 2051 Rectal     SpO2 09/04/21 2051 (!) 89 %     Weight 09/04/21 2052 242 lb 8.1 oz (110 kg)     Height 09/04/21 2052 6\' 2"  (1.88 m)     Head Circumference --      Peak Flow --      Pain Score --      Pain Loc --      Pain Edu? --      Excl. in GC? --     Most recent vital signs: Vitals:   09/04/21 2200 09/04/21 2230  BP: (!) 102/58 110/68  Pulse: 100 97  Resp: 19 17  Temp:    SpO2: 95% 98%    Constitutional: Minimally responsive, does not arouse to voice. Eyes: Conjunctivae are normal.  Pupils equal, round, and reactive to light bilaterally. Head: Atraumatic. Nose: No congestion/rhinnorhea. Mouth/Throat: Mucous membranes are moist.   Cardiovascular: Tachycardic, regular rhythm. Grossly normal heart sounds.  2+ radial pulses bilaterally. Respiratory: Tachypneic with increased respiratory effort, crackles noted throughout. Gastrointestinal: Soft and nontender. No distention. Musculoskeletal: No lower extremity tenderness nor edema.  Neurologic: Responds verbally to painful stimuli and localizes to pain, does not respond to verbal stimuli.    ED Results / Procedures / Treatments   Labs (all labs ordered are listed, but only abnormal results are displayed) Labs Reviewed  CBC WITH DIFFERENTIAL/PLATELET - Abnormal; Notable for the following components:      Result Value   WBC 10.7 (*)    Neutro Abs 9.1 (*)    Lymphs Abs 0.5 (*)    Monocytes Absolute 1.1 (*)    All other components within normal limits  COMPREHENSIVE METABOLIC PANEL - Abnormal; Notable for the following components:   Potassium 3.2 (*)    Glucose, Bld 123 (*)    Calcium 8.0 (*)    Total Protein 5.8 (*)    Albumin 3.2 (*)    All other components within normal limits  SARS CORONAVIRUS 2 BY RT PCR  CULTURE, BLOOD (ROUTINE X  2)  CULTURE, BLOOD (ROUTINE X 2)  LIPASE, BLOOD  LACTIC ACID, PLASMA  LACTIC ACID, PLASMA  URINALYSIS, ROUTINE W REFLEX MICROSCOPIC  TROPONIN I (HIGH SENSITIVITY)  TROPONIN I (HIGH SENSITIVITY)     EKG  ED ECG REPORT I, Chesley Noon, the attending physician, personally viewed and interpreted this ECG.   Date: 09/04/2021  EKG Time: 21:52  Rate: 106  Rhythm: sinus tachycardia  Axis: Normal  Intervals:none  ST&T Change: None  RADIOLOGY Chest x-ray reviewed and interpreted by me with no infiltrate, edema, or effusion.  PROCEDURES:  Critical Care performed: Yes, see critical care procedure note(s)  .Critical Care  Performed by: Chesley Noon, MD Authorized by: Chesley Noon, MD   Critical care provider statement:    Critical care time (minutes):  30   Critical care time was exclusive of:  Separately  billable procedures and treating other patients and teaching time   Critical care was necessary to treat or prevent imminent or life-threatening deterioration of the following conditions:  Sepsis and respiratory failure   Critical care was time spent personally by me on the following activities:  Development of treatment plan with patient or surrogate, discussions with consultants, evaluation of patient's response to treatment, examination of patient, ordering and review of laboratory studies, ordering and review of radiographic studies, ordering and performing treatments and interventions, pulse oximetry, re-evaluation of patient's condition and review of old charts   I assumed direction of critical care for this patient from another provider in my specialty: no     Care discussed with: admitting provider      MEDICATIONS ORDERED IN ED: Medications  lactated ringers bolus 1,000 mL (0 mLs Intravenous Stopped 09/04/21 2314)  Ampicillin-Sulbactam (UNASYN) 3 g in sodium chloride 0.9 % 100 mL IVPB (0 g Intravenous Stopped 09/04/21 2210)  ondansetron (ZOFRAN) injection 4 mg (4 mg Intravenous Given 09/04/21 2142)  lactated ringers bolus 1,000 mL (0 mLs Intravenous Stopped 09/04/21 2314)     IMPRESSION / MDM / ASSESSMENT AND PLAN / ED COURSE  I reviewed the triage vital signs and the nursing notes.                              71 y.o. male with past medical history of hypertension, hypothyroidism, dementia, and recurrent aspiration who presents to the ED for altered mental status, fever, and vomiting noted at his nursing facility.  Patient's presentation is most consistent with acute presentation with potential threat to life or bodily function.  Differential diagnosis includes, but is not limited to, sepsis, aspiration, pneumonia, UTI, intra-abdominal infection, AKI, electrolyte abnormality, respiratory failure, ACS, PE.  Patient ill-appearing on arrival, tachycardic and tachypneic with room air  saturations of 89%.  He was placed on 2 L nasal cannula with improvement, but given patient with vomiting and concern for aspiration, he is not a candidate for BiPAP.  He has a DNR in place and we will hold off on intubation, if work of breathing does not improve then would consider high flow nasal cannula.  Vital signs do appear concerning for sepsis, especially given his fever noted at nursing home.  Blood cultures and lactic acid drawn, we will hydrate with IV fluids and start on Unasyn for suspected aspiration.  Chest x-ray and urinalysis are pending.  Chest x-ray without focal infiltrate but aspiration event seems to have occurred earlier today and findings may be delayed on chest x-ray.  He is currently maintaining oxygen  saturations on 2 L nasal cannula, work of breathing gradually improving following antibiotics and IV fluids.  Labs are reassuring without significant leukocytosis or anemia, troponin within normal limits, no AKI or significant electrolyte abnormality noted.  Case discussed with hospitalist for admission for aspiration, sepsis, and hypoxic respiratory failure.      FINAL CLINICAL IMPRESSION(S) / ED DIAGNOSES   Final diagnoses:  Sepsis with acute hypoxic respiratory failure without septic shock, due to unspecified organism (HCC)  Aspiration pneumonia, unspecified aspiration pneumonia type, unspecified laterality, unspecified part of lung (HCC)  Altered mental status, unspecified altered mental status type     Rx / DC Orders   ED Discharge Orders     None        Note:  This document was prepared using Dragon voice recognition software and may include unintentional dictation errors.   Chesley Noon, MD 09/04/21 [phone]

## 2021-09-04 NOTE — Assessment & Plan Note (Addendum)
Presently nonverbal.  Patient is also DNR. Continue Aricept and Namenda. Continue Seroquel, Remeron, and trazodone  Consider consultation with palliative care to discuss goals of care.

## 2021-09-04 NOTE — Assessment & Plan Note (Signed)
Hopefully will be able to return

## 2021-09-04 NOTE — H&P (Signed)
History and Physical    Patient: Parker Chambers KWI:[id number] DOB: 06-02-1950 DOA: 09/04/2021 DOS: the patient was seen and examined on 09/04/2021 PCP: Wilford Corner, PA-C  Patient coming from: SNF  Chief Complaint:  Chief Complaint  Patient presents with   Aspiration   Vomiting   HPI: Parker Chambers is a 71 y.o. male with medical history significant of hypertension, hypothyroidism, advanced dementia who presents from his skilled nursing facility with likely aspiration.  He was noted to have altered mental status where he was no longer responding to voice though he is always nonverbal.  He was also found to be febrile at 102.1.  He threw up twice. In the ED was noted to be in acute respiratory distress with a respiratory rate of 36 and oxygen saturations of 88% on room air.  He was placed on 2 L nasal cannula given IV fluids and Unasyn and his respiratory rate started to improve.  A discussion was had with his caregivers by the EDP and they wanted to have him admitted with attempts to see how IV antibiotics went that he is to remain DNR.  Review of Systems: unable to review all systems due to the inability of the patient to answer questions. Past Medical History:  Diagnosis Date   Dementia (HCC)    Thyroid disease    Past Surgical History:  Procedure Laterality Date   HEMORRHOID SURGERY     Social History:  reports that he quit smoking about 28 years ago. He has never used smokeless tobacco. He reports that he does not drink alcohol and does not use drugs.  No Known Allergies  Family History  Problem Relation Age of Onset   Ovarian cancer Mother    Pancreatic cancer Father     Prior to Admission medications   Medication Sig Start Date End Date Taking? Authorizing Provider  acetaminophen (TYLENOL) 650 MG CR tablet Take 650 mg by mouth every 8 (eight) hours as needed for pain.   Yes [provider]  acetaminophen (TYLENOL) 650 MG suppository Place 650 mg  rectally every 4 (four) hours as needed.   Yes [provider]  amLODipine (NORVASC) 10 MG tablet Take 1 tablet (10 mg total) by mouth daily. 08/10/19  Yes Enedina Finner, MD  Cholecalciferol 25 MCG (1000 UT) tablet Take 1,000 Units by mouth daily. 12/12/18  Yes [provider]  docusate sodium (COLACE) 100 MG capsule Take 100 mg by mouth daily.   Yes [provider]  donepezil (ARICEPT) 10 MG tablet Take 10 mg by mouth at bedtime.   Yes [provider]  folic acid (FOLVITE) 1 MG tablet Take 1 mg by mouth daily.   Yes [provider]  guaiFENesin (ROBITUSSIN) 100 MG/5ML liquid Take 5 mLs by mouth every 4 (four) hours as needed for cough or to loosen phlegm.   Yes [provider]  Infant Care Products [facility] EX) Apply 1 Application topically daily as needed.   Yes [provider]  levothyroxine (SYNTHROID) 125 MCG tablet Take 125 mcg by mouth daily before breakfast.    Yes [provider]  loratadine (CLARITIN) 10 MG tablet Take 10 mg by mouth daily. 05/02/19 09/04/21 Yes [provider]  memantine (NAMENDA) 10 MG tablet Take 10 mg by mouth 2 (two) times daily.   Yes [provider]  mirtazapine (REMERON) 30 MG tablet Take 30 mg by mouth at bedtime.   Yes [provider]  omeprazole (PRILOSEC) 20 MG capsule Take 20  mg by mouth daily. 07/11/19  Yes [provider]  QUEtiapine (SEROQUEL) 100 MG tablet Take 100 mg by mouth at bedtime. 12/12/18  Yes [provider]  QUEtiapine (SEROQUEL) 25 MG tablet Take 25 mg by mouth at bedtime. 12/12/18  Yes [provider]  traZODone (DESYREL) 50 MG tablet Take 50 mg by mouth at bedtime.   Yes [provider]  LORazepam (ATIVAN) 0.5 MG tablet Take 1 tablet (0.5 mg total) by mouth 2 (two) times daily as needed for anxiety. Patient not taking: Reported on 09/04/2021 08/09/19   Enedina Finner, MD    Physical Exam: Vitals:   09/04/21 2100  09/04/21 2145 09/04/21 2200 09/04/21 2230  BP: (!) 118/57 117/77 (!) 102/58 110/68  Pulse: (!) 111 (!) 105 100 97  Resp: (!) 21 20 19 17   Temp:      TempSrc:      SpO2: 94% 95% 95% 98%  Weight:      Height:       Physical Examination: General appearance - normal appearing weight and chronically ill appearing Mental status -patient is unresponsive to verbal stimuli Neck - supple, no significant adenopathy Chest - rhonchi noted throughout Heart - S1 and S2 normal Abdomen - soft, nontender, nondistended, no masses or organomegaly Extremities - peripheral pulses normal, no pedal edema, no clubbing or cyanosis, pedal edema 1 + Skin - normal coloration and turgor, no rashes, no suspicious skin lesions noted Pulses - 2+ at [facility]  Data Reviewed: CT Head Wo Contrast  Result Date: 09/04/2021 CLINICAL DATA:  Altered mental status. EXAM: CT HEAD WITHOUT CONTRAST TECHNIQUE: Contiguous axial images were obtained from the base of the skull through the vertex without intravenous contrast. RADIATION DOSE REDUCTION: This exam was performed according to the departmental dose-optimization program which includes automated exposure control, adjustment of the mA and/or kV according to patient size and/or use of iterative reconstruction technique. COMPARISON:  Head CT dated 08/04/2019. FINDINGS: Brain: Moderate age-related atrophy and advanced chronic microvascular ischemic changes. There is no acute intracranial hemorrhage. No mass effect or midline shift. No extra-axial fluid collection. Vascular: No hyperdense vessel or unexpected calcification. Skull: Normal. Negative for fracture or focal lesion. Sinuses/Orbits: There is partial opacification of several ethmoid air cells and right maxillary sinus. The mastoid air cells are clear. Other: None IMPRESSION: 1. No acute intracranial pathology. 2. Moderate age-related atrophy and advanced chronic microvascular ischemic changes. 3. Paranasal sinus disease. Electronically  Signed   By: 08/06/2019 M.D.   On: 09/04/2021 22:06   DG Chest Portable 1 View  Result Date: 09/04/2021 CLINICAL DATA:  Sepsis.  Aspiration. EXAM: PORTABLE CHEST 1 VIEW COMPARISON:  Chest radiograph dated 08/04/2019 and CT dated 08/04/2019 FINDINGS: There is shallow inspiration. Bibasilar streaky densities, left greater right, likely atelectasis. Developing infiltrate is less likely but not excluded. Clinical correlation is recommended. No pleural effusion or pneumothorax. The cardiac silhouette is within limits. Atherosclerotic calcification of the aorta. Degenerative changes of the spine no acute osseous pathology. IMPRESSION: Shallow inspiration with bibasilar atelectasis. Developing infiltrate is less likely but not excluded. Electronically Signed   By: 08/06/2019 M.D.   On: 09/04/2021 21:40   Results for orders placed or performed during the hospital encounter of 09/04/21 (from the past 24 hour(s))  CBC with Differential     Status: Abnormal   Collection Time: 09/04/21  9:04 PM  Result Value Ref Range   WBC 10.7 (H) 4.0 - 10.5 K/uL   RBC 4.64 4.22 - 5.81  MIL/uL   Hemoglobin 13.2 13.0 - 17.0 g/dL   HCT 79.8 92.1 - 19.4 %   MCV 89.2 80.0 - 100.0 fL   MCH 28.4 26.0 - 34.0 pg   MCHC 31.9 30.0 - 36.0 g/dL   RDW 17.4 08.1 - 44.8 %   Platelets 218 150 - 400 K/uL   nRBC 0.0 0.0 - 0.2 %   Neutrophils Relative % 86 %   Neutro Abs 9.1 (H) 1.7 - 7.7 K/uL   Lymphocytes Relative 4 %   Lymphs Abs 0.5 (L) 0.7 - 4.0 K/uL   Monocytes Relative 10 %   Monocytes Absolute 1.1 (H) 0.1 - 1.0 K/uL   Eosinophils Relative 0 %   Eosinophils Absolute 0.0 0.0 - 0.5 K/uL   Basophils Relative 0 %   Basophils Absolute 0.0 0.0 - 0.1 K/uL   Immature Granulocytes 0 %   Abs Immature Granulocytes 0.04 0.00 - 0.07 K/uL  Comprehensive metabolic panel     Status: Abnormal   Collection Time: 09/04/21  9:04 PM  Result Value Ref Range   Sodium 141 135 - 145 mmol/L   Potassium 3.2 (L) 3.5 - 5.1 mmol/L    Chloride 107 98 - 111 mmol/L   CO2 27 22 - 32 mmol/L   Glucose, Bld 123 (H) 70 - 99 mg/dL   BUN 15 8 - 23 mg/dL   Creatinine, Ser 1.85 0.61 - 1.24 mg/dL   Calcium 8.0 (L) 8.9 - 10.3 mg/dL   Total Protein 5.8 (L) 6.5 - 8.1 g/dL   Albumin 3.2 (L) 3.5 - 5.0 g/dL   AST 16 15 - 41 U/L   ALT 14 0 - 44 U/L   Alkaline Phosphatase 65 38 - 126 U/L   Total Bilirubin 0.7 0.3 - 1.2 mg/dL   GFR, Estimated >63 >14 mL/min   Anion gap 7 5 - 15  Lipase, blood     Status: None   Collection Time: 09/04/21  9:04 PM  Result Value Ref Range   Lipase 38 11 - 51 U/L  Troponin I (High Sensitivity)     Status: None   Collection Time: 09/04/21  9:04 PM  Result Value Ref Range   Troponin I (High Sensitivity) 10 <18 ng/L  Lactic acid, plasma     Status: None   Collection Time: 09/04/21  9:04 PM  Result Value Ref Range   Lactic Acid, Venous 1.0 0.5 - 1.9 mmol/L  SARS Coronavirus 2 by RT PCR (hospital order, performed in [facility] hospital lab) *cepheid single result test* Anterior Nasal Swab     Status: None   Collection Time: 09/04/21  9:04 PM   Specimen: Anterior Nasal Swab  Result Value Ref Range   SARS Coronavirus 2 by RT PCR NEGATIVE NEGATIVE    Assessment and Plan: * Aspiration pneumonia (HCC) On Rocephin and azithromycin Respiratory support  Resides in skilled nursing facility Hopefully will be able to return  Essential hypertension Continue Norvasc  Altered mental status Different from baseline according to the folks at the SNF Likely related to sepsis and aspiration.  Hypothyroidism Continue Synthroid  Dementia (HCC) Presently nonverbal.  Patient is also DNR. Continue Aricept and Namenda. Continue Seroquel, Remeron,  and trazodone  Consider consultation with palliative care to discuss goals of care.  Sepsis (HCC) Fever, hypoxemia, tachycardia.  Suspect primary diagnosis is aspiration pneumonia      Advance Care Planning:   Code Status: Prior DNR  Consults: None  Family  Communication: By EDP sister in  law and wife  Severity of Illness: The appropriate patient status for this patient is INPATIENT. Inpatient status is judged to be reasonable and necessary in order to provide the required intensity of service to ensure the patient's safety. The patient's presenting symptoms, physical exam findings, and initial radiographic and laboratory data in the context of their chronic comorbidities is felt to place them at high risk for further clinical deterioration. Furthermore, it is not anticipated that the patient will be medically stable for discharge from the hospital within 2 midnights of admission.   I certify that at the point of admission it is Parker clinical judgment that the patient will require inpatient hospital care spanning beyond 2 midnights from the point of admission due to high intensity of service, high risk for further deterioration and high frequency of surveillance required.  Author: Reva Bores, MD 09/04/2021 10:49 PM  For on call review [email or web address].

## 2021-09-05 DIAGNOSIS — J690 Pneumonitis due to inhalation of food and vomit: Secondary | ICD-10-CM

## 2021-09-05 LAB — CBC
HCT: 44 % (ref 39.0–52.0)
Hemoglobin: 13.9 g/dL (ref 13.0–17.0)
MCH: 28.8 pg (ref 26.0–34.0)
MCHC: 31.6 g/dL (ref 30.0–36.0)
MCV: 91.1 fL (ref 80.0–100.0)
Platelets: 216 10*3/uL (ref 150–400)
RBC: 4.83 MIL/uL (ref 4.22–5.81)
RDW: 13.1 % (ref 11.5–15.5)
WBC: 10.9 10*3/uL — ABNORMAL HIGH (ref 4.0–10.5)
nRBC: 0 % (ref 0.0–0.2)

## 2021-09-05 LAB — BASIC METABOLIC PANEL
Anion gap: 5 (ref 5–15)
BUN: 11 mg/dL (ref 8–23)
CO2: 32 mmol/L (ref 22–32)
Calcium: 8.5 mg/dL — ABNORMAL LOW (ref 8.9–10.3)
Chloride: 107 mmol/L (ref 98–111)
Creatinine, Ser: 1.02 mg/dL (ref 0.61–1.24)
GFR, Estimated: >60 mL/min (ref >60)
Glucose, Bld: 102 mg/dL — ABNORMAL HIGH (ref 70–99)
Potassium: 3.5 mmol/L (ref 3.5–5.1)
Sodium: 144 mmol/L (ref 135–145)

## 2021-09-05 LAB — PROCALCITONIN: Procalcitonin: 4.02 ng/mL

## 2021-09-05 LAB — TROPONIN I (HIGH SENSITIVITY): Troponin I (High Sensitivity): 10 ng/L (ref <18)

## 2021-09-05 LAB — LACTIC ACID, PLASMA: Lactic Acid, Venous: 1.2 mmol/L (ref 0.5–1.9)

## 2021-09-05 LAB — STREP PNEUMONIAE URINARY ANTIGEN: Strep Pneumo Urinary Antigen: NEGATIVE

## 2021-09-05 MED ORDER — TRAZODONE HCL 50 MG PO TABS
50.0000 mg | ORAL_TABLET | Freq: Every day | ORAL | Status: DC
Start: 2021-09-05 — End: 2021-09-09
  Administered 2021-09-06 – 2021-09-08 (×3): 50 mg via ORAL
  Filled 2021-09-05 (×3): qty 1

## 2021-09-05 MED ORDER — QUETIAPINE FUMARATE 25 MG PO TABS
125.0000 mg | ORAL_TABLET | Freq: Every day | ORAL | Status: DC
  Administered 2021-09-06 – 2021-09-08 (×3): 125 mg via ORAL
  Filled 2021-09-05 (×3): qty 5

## 2021-09-05 MED ORDER — QUETIAPINE FUMARATE 25 MG PO TABS: 25.0000 mg | ORAL_TABLET | Freq: Every day | ORAL | Status: DC

## 2021-09-05 MED ORDER — ORAL CARE MOUTH RINSE: 15.0000 mL | OROMUCOSAL | Status: DC | PRN

## 2021-09-05 MED ORDER — VITAMIN D 25 MCG (1000 UNIT) PO TABS
1000.0000 [IU] | ORAL_TABLET | Freq: Every day | ORAL | Status: DC
  Administered 2021-09-07 – 2021-09-09 (×3): 1000 [IU] via ORAL
  Filled 2021-09-05 (×3): qty 1

## 2021-09-05 MED ORDER — MEMANTINE HCL 5 MG PO TABS
10.0000 mg | ORAL_TABLET | Freq: Two times a day (BID) | ORAL | Status: DC
  Administered 2021-09-06 – 2021-09-09 (×6): 10 mg via ORAL
  Filled 2021-09-05 (×6): qty 2

## 2021-09-05 MED ORDER — MIRTAZAPINE 15 MG PO TABS
30.0000 mg | ORAL_TABLET | Freq: Every day | ORAL | Status: DC
Start: 2021-09-05 — End: 2021-09-09
  Administered 2021-09-06 – 2021-09-08 (×3): 30 mg via ORAL
  Filled 2021-09-05 (×3): qty 2

## 2021-09-05 MED ORDER — SODIUM CHLORIDE 0.9% FLUSH: 3.0000 mL | INTRAVENOUS | Status: DC | PRN

## 2021-09-05 MED ORDER — SODIUM CHLORIDE 0.9 % IV SOLN: 500.0000 mg | Freq: Every day | INTRAVENOUS | Status: DC

## 2021-09-05 MED ORDER — SODIUM CHLORIDE 0.9 % IV SOLN: 2.0000 g | Freq: Every day | INTRAVENOUS | Status: DC

## 2021-09-05 MED ORDER — ENOXAPARIN SODIUM 60 MG/0.6ML IJ SOSY
0.5000 mg/kg | PREFILLED_SYRINGE | INTRAMUSCULAR | Status: DC
  Administered 2021-09-05 – 2021-09-09 (×5): 55 mg via SUBCUTANEOUS
  Filled 2021-09-05 (×5): qty 0.6

## 2021-09-05 MED ORDER — METOPROLOL TARTRATE 5 MG/5ML IV SOLN: 5.0000 mg | Freq: Four times a day (QID) | INTRAVENOUS | Status: DC | PRN

## 2021-09-05 MED ORDER — AMLODIPINE BESYLATE 10 MG PO TABS
10.0000 mg | ORAL_TABLET | Freq: Every day | ORAL | Status: DC
  Administered 2021-09-07 – 2021-09-09 (×3): 10 mg via ORAL
  Filled 2021-09-05 (×3): qty 1

## 2021-09-05 MED ORDER — LEVOTHYROXINE SODIUM 50 MCG PO TABS
125.0000 ug | ORAL_TABLET | Freq: Every day | ORAL | Status: DC
Start: 2021-09-05 — End: 2021-09-09
  Administered 2021-09-07 – 2021-09-09 (×3): 125 ug via ORAL
  Filled 2021-09-05 (×3): qty 1

## 2021-09-05 MED ORDER — PANTOPRAZOLE SODIUM 40 MG PO TBEC
40.0000 mg | DELAYED_RELEASE_TABLET | Freq: Every day | ORAL | Status: DC
  Administered 2021-09-07: 40 mg via ORAL
  Filled 2021-09-05: qty 1

## 2021-09-05 MED ORDER — SODIUM CHLORIDE 0.9% FLUSH
3.0000 mL | Freq: Two times a day (BID) | INTRAVENOUS | Status: DC
  Administered 2021-09-05 – 2021-09-08 (×7): 3 mL via INTRAVENOUS

## 2021-09-05 MED ORDER — GUAIFENESIN 100 MG/5ML PO LIQD: 5.0000 mL | ORAL | Status: DC | PRN

## 2021-09-05 MED ORDER — DOCUSATE SODIUM 100 MG PO CAPS
100.0000 mg | ORAL_CAPSULE | Freq: Every day | ORAL | Status: DC
  Administered 2021-09-07 – 2021-09-09 (×3): 100 mg via ORAL
  Filled 2021-09-05 (×3): qty 1

## 2021-09-05 MED ORDER — SODIUM CHLORIDE 0.9 % IV SOLN
3.0000 g | Freq: Four times a day (QID) | INTRAVENOUS | Status: DC
  Administered 2021-09-05 – 2021-09-09 (×17): 3 g via INTRAVENOUS
  Filled 2021-09-05 (×3): qty 8
  Filled 2021-09-05 (×2): qty 3
  Filled 2021-09-05 (×2): qty 8
  Filled 2021-09-05 (×2): qty 3
  Filled 2021-09-05 (×3): qty 8
  Filled 2021-09-05: qty 3
  Filled 2021-09-05 (×2): qty 8
  Filled 2021-09-05: qty 3
  Filled 2021-09-05 (×3): qty 8
  Filled 2021-09-05: qty 3

## 2021-09-05 MED ORDER — LORATADINE 10 MG PO TABS
10.0000 mg | ORAL_TABLET | Freq: Every day | ORAL | Status: DC
  Administered 2021-09-07 – 2021-09-09 (×3): 10 mg via ORAL
  Filled 2021-09-05 (×3): qty 1

## 2021-09-05 MED ORDER — ACETAMINOPHEN 325 MG PO TABS: 650.0000 mg | ORAL_TABLET | Freq: Four times a day (QID) | ORAL | Status: DC | PRN

## 2021-09-05 MED ORDER — DONEPEZIL HCL 5 MG PO TABS
10.0000 mg | ORAL_TABLET | Freq: Every day | ORAL | Status: DC
  Administered 2021-09-06 – 2021-09-08 (×3): 10 mg via ORAL
  Filled 2021-09-05 (×3): qty 2

## 2021-09-05 MED ORDER — ORAL CARE MOUTH RINSE
15.0000 mL | OROMUCOSAL | Status: DC
  Administered 2021-09-05 – 2021-09-08 (×13): 15 mL via OROMUCOSAL

## 2021-09-05 MED ORDER — ACETAMINOPHEN 650 MG RE SUPP
650.0000 mg | Freq: Four times a day (QID) | RECTAL | Status: DC | PRN
  Administered 2021-09-05: 650 mg via RECTAL
  Filled 2021-09-05: qty 1

## 2021-09-05 MED ORDER — FOLIC ACID 1 MG PO TABS
1.0000 mg | ORAL_TABLET | Freq: Every day | ORAL | Status: DC
  Administered 2021-09-07 – 2021-09-08 (×2): 1 mg via ORAL
  Filled 2021-09-05 (×3): qty 1

## 2021-09-05 MED ORDER — SODIUM CHLORIDE 0.9 % IV SOLN: 250.0000 mL | INTRAVENOUS | Status: DC | PRN

## 2021-09-05 MED ORDER — SODIUM CHLORIDE 0.9 % IV SOLN: INTRAVENOUS | Status: AC

## 2021-09-05 NOTE — ED Notes (Signed)
Pt has removed purewick and urinated in bed. Pt cleansed, new sheets and brief applied.

## 2021-09-05 NOTE — ED Notes (Signed)
Informed RN bed assigned 

## 2021-09-05 NOTE — ED Notes (Signed)
Pt in bed, pt opens eyes to verbal stim, pt has wet cough, pt will speak, but inappropriate words/ incomprehensible speech.  Pt on O2 sat monitor, satting mid 90s on 2L via [place].

## 2021-09-05 NOTE — Progress Notes (Signed)
Cross Cover Antibiotics changed to unasyn from rocephin and azithromycin as per recommended guidelines for aspiration pneumonia treatment

## 2021-09-05 NOTE — ED Notes (Signed)
Per md, will hold oral medications until pt is more awake and can take them.  Pt in bed, pt arouses to verbal stim, pt having difficulty following directions, oral meds held at this time

## 2021-09-05 NOTE — Progress Notes (Signed)
Pharmacy Antibiotic Note  Parker Chambers is a 71 y.o. male admitted on 09/04/2021 with aspiration pneumonia.  Pharmacy has been consulted for Unasyn dosing.  Plan: Unasyn 3 gm q6h x 5 days per indication & renal fxn. Pharmacy will continue to follow and will adjust abx dosing whenever warranted.  Height: 6\' 2"  (188 cm) Weight: 110 kg (242 lb 8.1 oz) IBW/kg (Calculated) : 82.2  Temp (24hrs), Avg:100.2 F (37.9 C), Min:100.2 F (37.9 C), Max:100.2 F (37.9 C)  Recent Labs  Lab 09/04/21 2104  WBC 10.7*  CREATININE 1.01  LATICACIDVEN 1.2  1.0    Estimated Creatinine Clearance: 88.5 mL/min (by C-G formula based on SCr of 1.01 mg/dL).    No Known Allergies  Antimicrobials this admission: 6/17 Unasyn >> x 5 days  Microbiology results: 6/17 BCx: Pending 6/17 Sputum: Pending   Thank you for allowing pharmacy to be a part of this patient's care.  7/17, PharmD, [facility] 09/05/2021 2:12 AM

## 2021-09-05 NOTE — Progress Notes (Signed)
PHARMACIST - PHYSICIAN COMMUNICATION  CONCERNING:  Enoxaparin (Lovenox) for DVT Prophylaxis    RECOMMENDATION: Patient was prescribed enoxaprin 40mg  q24 hours for VTE prophylaxis.   Filed Weights   09/04/21 2052  Weight: 110 kg (242 lb 8.1 oz)    Body mass index is 31.14 kg/m.  Estimated Creatinine Clearance: 88.5 mL/min (by C-G formula based on SCr of 1.01 mg/dL).   Based on [facility] policy patient is candidate for enoxaparin 0.5mg /kg TBW SQ every 24 hours based on BMI being >30.  DESCRIPTION: Pharmacy has adjusted enoxaparin dose per [facility] policy.  Patient is now receiving enoxaparin 0.5 mg/kg every 24 hours   [facility], PharmD, [facility] 09/05/2021 1:38 AM

## 2021-09-05 NOTE — Sepsis Progress Note (Signed)
Notified bedside nurse of need to clarify when repeat lactic acid was drawn. It shows in results that both lactic acids were drawn at the same time. This was not clarified, so unsure when repeat lactic acid was drawn. Will continue to monitor code sepsis.

## 2021-09-05 NOTE — Progress Notes (Signed)
  Progress Note   Patient: Parker Chambers [id number] DOB: Jan 22, 1951 DOA: 09/04/2021     1 DOS: the patient was seen and examined on 09/05/2021   Brief hospital course: No notes on file  Assessment and Plan: * Aspiration pneumonia (HCC) --started on Unasyn --cont Unasyn --SLP eval  Resides in skilled nursing facility Hopefully will be able to return  Essential hypertension Continue Norvasc  Altered mental status Different from baseline according to the folks at the SNF Likely related to sepsis and aspiration. --per RN, currently not safe for oral intake Plan: --NS@50  for 20 hours  Hypothyroidism Continue Synthroid  Dementia (HCC) Presently nonverbal.  Patient is also DNR. Continue Aricept and Namenda. Continue Seroquel, Remeron, and trazodone  Consider consultation with palliative care to discuss goals of care.  Sepsis (HCC) Fever, tachycardia.  Suspect primary diagnosis is aspiration pneumonia        Subjective:  Pt was awake but kept his eyes shut and would only say "no".     Physical Exam:  Constitutional: NAD, eyes shut, awake but only said "no". CV: No cyanosis.   RESP: normal respiratory effort, on 3L SKIN: warm, dry   Data Reviewed:  Family Communication:   Disposition: Status is: Inpatient   Planned Discharge Destination: Skilled nursing facility    Time spent: 50 minutes  Author: Darlin Priestly, MD 09/05/2021 4:49 PM  For on call review [email or web address].

## 2021-09-05 NOTE — ED Notes (Signed)
Pt sitting up in bed, pt arouses to voice, pt remains confused, pt having difficulty following directions to take sips of water, md notified, inquired about possible speech language consult before oral meds.

## 2021-09-06 DIAGNOSIS — J690 Pneumonitis due to inhalation of food and vomit: Secondary | ICD-10-CM | POA: Diagnosis not present

## 2021-09-06 LAB — BASIC METABOLIC PANEL
Anion gap: 6 (ref 5–15)
BUN: 10 mg/dL (ref 8–23)
CO2: 31 mmol/L (ref 22–32)
Calcium: 8.6 mg/dL — ABNORMAL LOW (ref 8.9–10.3)
Chloride: 104 mmol/L (ref 98–111)
Creatinine, Ser: 0.89 mg/dL (ref 0.61–1.24)
GFR, Estimated: >60 mL/min (ref >60)
Glucose, Bld: 93 mg/dL (ref 70–99)
Potassium: 3.4 mmol/L — ABNORMAL LOW (ref 3.5–5.1)
Sodium: 141 mmol/L (ref 135–145)

## 2021-09-06 LAB — CBC
HCT: 43.9 % (ref 39.0–52.0)
Hemoglobin: 13.8 g/dL (ref 13.0–17.0)
MCH: 28.6 pg (ref 26.0–34.0)
MCHC: 31.4 g/dL (ref 30.0–36.0)
MCV: 90.9 fL (ref 80.0–100.0)
Platelets: 208 10*3/uL (ref 150–400)
RBC: 4.83 MIL/uL (ref 4.22–5.81)
RDW: 13.2 % (ref 11.5–15.5)
WBC: 10.8 10*3/uL — ABNORMAL HIGH (ref 4.0–10.5)
nRBC: 0 % (ref 0.0–0.2)

## 2021-09-06 LAB — MAGNESIUM: Magnesium: 2 mg/dL (ref 1.7–2.4)

## 2021-09-06 NOTE — Progress Notes (Signed)
  Progress Note   Patient: Parker Chambers [id number]:[id number] DOB: 01-Jan-1951 DOA: 09/04/2021     2 DOS: the patient was seen and examined on 09/06/2021   Brief hospital course: No notes on file  Assessment and Plan: * Aspiration pneumonia (HCC) --started on Unasyn --passed SLP eval today, but may have silent aspiration during sleep --cont Unasyn  Resides in skilled nursing facility --return to SNF  Essential hypertension Continue Norvasc  Altered mental status Different from baseline according to the folks at the SNF Likely related to sepsis and aspiration.  Hypothyroidism Continue Synthroid  Dementia (HCC) Presently nonverbal.  Patient is also DNR.  Followed by outpatient palliative care. Plan: Continue Aricept and Namenda. Continue Seroquel, Remeron, and trazodone   Sepsis (HCC) Fever, tachycardia.  Suspect primary diagnosis is aspiration pneumonia        Subjective:  Pt was more alert and interactive today.  Did well with SLP eval, started eating.     Physical Exam:  Constitutional: NAD, sleepy but arousable, not talking CV: No cyanosis.   RESP: normal respiratory effort, on 4L Extremities: No effusions, edema in BLE SKIN: warm, dry   Data Reviewed:  Family Communication: wife updated at bedside today  Disposition: Status is: Inpatient   Planned Discharge Destination: Skilled nursing facility    Time spent: 35 minutes  Author: Darlin Priestly, MD 09/06/2021 6:31 PM  For on call review [email or web address].

## 2021-09-06 NOTE — Evaluation (Signed)
Clinical/Bedside Swallow Evaluation Patient Details  Name: Parker Chambers MRN: [id number] Date of Birth: [birth year]  Today's Date: 09/06/2021 Time: SLP Start Time (ACUTE ONLY): 1000 SLP Stop Time (ACUTE ONLY): 1100 SLP Time Calculation (min) (ACUTE ONLY): 60 min  Past Medical History:  Past Medical History:  Diagnosis Date   Dementia (HCC)    Thyroid disease    Past Surgical History:  Past Surgical History:  Procedure Laterality Date   HEMORRHOID SURGERY     HPI:  Pt  is a 71 y.o. 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  Recent vomiting episodes x2 occurred per report. He was also found to be febrile at 102.1.  In the ED, was noted to be in acute respiratory distress with a respiratory rate of 36 and oxygen saturations of 88% on room air.  He was placed on 2 L nasal cannula given IV fluids and Unasyn and his respiratory rate started to improve.   CXR: Shallow inspiration with bibasilar atelectasis. Developing  infiltrate is less likely but not excluded.  Last Chest CT Imaging was in 07/2019 indicating probably atelectasis.  Pt is followed by Palliative Care at his living facility. He eats a mech soft diet w/ a "good" appetite; noted BMI.     Assessment / Plan / Recommendation  Clinical Impression  Pt appears to present w/ adequate oropharyngeal phase swallowing function w/ no gross oropharyngeal phase dysphagia appreciated; no neuromuscular swallowing deficits appreciated. Of note, pt exhibited Mild lingual and UE tremors and jerky movements at baseline during volitional tasks and when startled. He also exhibited inconsistent confusion during oral prep acceptance of boluses, but this subsided as po trials continued. Suspect impact from Cognitive decline and  unknown feeder. He required Mod+ verbal/visual/tactile cues for follow through w/ tasks such as holding his own Cup for drinking and bolus acceptance initially d/t baseline Dementia/Cognitive decline. ANT Cognitive decline can impact overall awareness/timing of swallow and safety during po tasks which increases risk for aspiration, choking. Pt's risk for aspiration can be reduced when following general aspiration precautions and using the modified diet consistency of Mech Soft foods as he does at baseline.   However, unsure if pt might not have risk for aspiration of REFLUX material; if he has any baseline of REFLUX d/t Family report of chronic "PHLEGM" for "2 years now". Noted "Small hiatal hernia" and charted Chronic Cough, Phelgm in Outpatient MD notes. Pt is on a PPI this admit.  Pt is strongly recommended to f/u w/ MD/GI to r/o Esophageal dysmotility and management REFLUX.   Pt required positioning upright in bed; full support. He was then presented po trials. He consumed trials of thin liquids via tsp/Straw, puree, and soft solids w/ no overt clinical s/s of aspiration noted; clear vocal quality b/t trials during few words, no cough, and no decline in respiratory effort. O2 sats remained 98%. Oral phase appeared [facility] for bolus management, mastication, and A-P transfer of po's for swallow. Oral clearing achieved w/ all consistencies. Again, mild confusion during oral prep initially. Full Feeding Support required but pt helped to hold cup at times. OM exam was cursory but appeared [facility] for oral clearing; lingual/labial movements. No unilateral weakness.   Recommend continue a Mech soft diet w/ moistened foods; thin liquids. Recommend general aspiration precautions and Full Feeding Support at  meals. Reduce Distractions at meals to allow better focus. General REFLUX precautions strongly recommended to lessen chance for Regurgitation. Pills CRUSHED in Puree. Recommend pt f/u w/ GI for assessment to r/o Reflux  and/or tx as indicated. MD/NSG updated. NSG to reconsult ST services if any new needs while admitted. Recommend continued f/u w/ Palliative Care services at his NH for support.  SLP Visit Diagnosis: Dysphagia, oral phase (R13.11) (decreased oral prep awareness intermittently; baseline Dementia)    Aspiration Risk  Mild aspiration risk;Risk for inadequate nutrition/hydration (reduced following general aspiration precautions)    Diet Recommendation   Mech soft diet w/ moistened foods; thin liquids. Recommend general aspiration precautions and Full Feeding Support at meals. Reduce Distractions at meals to allow better focus. General REFLUX precautions strongly recommended to lessen chance for Regurgitation.   Medication Administration: Crushed with puree (baseline for pt)    Other  Recommendations Recommended Consults: Consider GI evaluation;Consider esophageal assessment (PPI) Oral Care Recommendations: Oral care BID;Oral care before and after PO;Staff/trained caregiver to provide oral care Other Recommendations:  (n/a)    Recommendations for follow up therapy are one component of a multi-disciplinary discharge planning process, led by the attending physician.  Recommendations may be updated based on patient status, additional functional criteria and insurance authorization.  Follow up Recommendations No SLP follow up -- pt appears at his baseline per chart notes and Family report     Assistance Recommended at Discharge Frequent or constant Supervision/Assistance (baseline -- for feeding)  Functional Status Assessment Patient has not had a recent decline in their functional status (re: swallowing)  Frequency and Duration  (n/a)   (n/a)       Prognosis Prognosis for Safe Diet Advancement: Fair (-Good) Barriers to Reach Goals: Cognitive deficits;Time post onset;Severity of deficits Barriers/Prognosis Comment: baseline Dementia and dependent feeder      Swallow Study   General Date of  Onset: 09/04/21 HPI: Pt  is a 71 y.o. 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  Recent vomiting episodes x2 occurred per report. He was also found to be febrile at 102.1.  In the ED, was noted to be in acute respiratory distress with a respiratory rate of 36 and oxygen saturations of 88% on room air.  He was placed on 2 L nasal cannula given IV fluids and Unasyn and his respiratory rate started to improve.  CXR: Shallow inspiration with bibasilar atelectasis. Developing  infiltrate is less likely but not excluded.  Pt is followed by Palliative Care at his living facility. He eats a mech soft diet w/ a "good" appetite; noted BMI.  Last Chest CT Imaging was in 07/2019 indicing probably atelectasis. Type of Study: Bedside Swallow Evaluation Previous Swallow Assessment: 07/2019 Diet Prior to this Study: Dysphagia 3 (soft);Thin liquids (baseline) Temperature Spikes Noted: No (wbc 10.8 declining) Respiratory Status: Nasal cannula (2-4L) History of Recent Intubation: No Behavior/Cognition: Alert;Cooperative;Pleasant mood;Confused;Distractible;Requires cueing (baseline Dementia) Oral Cavity Assessment: Dry (sticky) Oral Care Completed by SLP: Yes Oral Cavity - Dentition: Adequate natural dentition Vision:  (n/a) Self-Feeding Abilities: Total assist (can help to hold cup at times) Patient Positioning: Upright in bed (needed positioning help) Baseline Vocal Quality: Low vocal intensity (responded 3-4x w/ single word) Volitional Cough: Cognitively unable to elicit Volitional Swallow: Unable to elicit    Oral/Motor/Sensory Function Overall Oral Motor/Sensory  Function: Within functional limits (w/ bolus management and oral clearing)   Ice Chips Ice chips: Impaired (during oral  prep) Presentation: Spoon (fed) Oral Phase Impairments: Poor awareness of bolus (x3 trials) Pharyngeal Phase Impairments:  (none)   Thin Liquid Thin Liquid: Impaired (oral prep awareness) Presentation: Spoon;Straw (3 trials via tsp; ~10-12 ozs via straw) Oral Phase Impairments: Poor awareness of bolus Pharyngeal  Phase Impairments:  (none) Other Comments: attempted to help hold cup w/ SLP    Nectar Thick Nectar Thick Liquid: Not tested   Honey Thick Honey Thick Liquid: Not tested   Puree Puree: Within functional limits Presentation: Spoon (fed; ~5 ozs total) Other Comments: pudding, applesauce   Solid     Solid: Within functional limits (w/ mech soft trials -- baseline for pt) Presentation: Spoon (fed; 10 trials)        Parker Som, MS, CCC-SLP Speech Language Pathologist Rehab Services; [facility] [phone] (ascom) Nana [place] 09/06/2021,1:50 PM

## 2021-09-07 ENCOUNTER — Inpatient Hospital Stay: Payer: Medicare Other

## 2021-09-07 DIAGNOSIS — R509 Fever, unspecified: Secondary | ICD-10-CM

## 2021-09-07 DIAGNOSIS — J690 Pneumonitis due to inhalation of food and vomit: Secondary | ICD-10-CM | POA: Diagnosis not present

## 2021-09-07 DIAGNOSIS — B348 Other viral infections of unspecified site: Secondary | ICD-10-CM | POA: Insufficient documentation

## 2021-09-07 LAB — RESPIRATORY PANEL BY PCR

## 2021-09-07 LAB — CBC
HCT: 41.1 % (ref 39.0–52.0)
Hemoglobin: 13.1 g/dL (ref 13.0–17.0)
MCH: 28.6 pg (ref 26.0–34.0)
MCHC: 31.9 g/dL (ref 30.0–36.0)
MCV: 89.7 fL (ref 80.0–100.0)
Platelets: 190 10*3/uL (ref 150–400)
RBC: 4.58 MIL/uL (ref 4.22–5.81)
RDW: 13.2 % (ref 11.5–15.5)
WBC: 7.1 10*3/uL (ref 4.0–10.5)
nRBC: 0 % (ref 0.0–0.2)

## 2021-09-07 LAB — MAGNESIUM: Magnesium: 2 mg/dL (ref 1.7–2.4)

## 2021-09-07 LAB — LEGIONELLA PNEUMOPHILA SEROGP 1 UR AG: L. pneumophila Serogp 1 Ur Ag: NEGATIVE

## 2021-09-07 LAB — BASIC METABOLIC PANEL
Anion gap: 7 (ref 5–15)
BUN: 15 mg/dL (ref 8–23)
CO2: 30 mmol/L (ref 22–32)
Calcium: 8.4 mg/dL — ABNORMAL LOW (ref 8.9–10.3)
Chloride: 106 mmol/L (ref 98–111)
Creatinine, Ser: 0.91 mg/dL (ref 0.61–1.24)
GFR, Estimated: >60 mL/min (ref >60)
Glucose, Bld: 89 mg/dL (ref 70–99)
Potassium: 3.1 mmol/L — ABNORMAL LOW (ref 3.5–5.1)
Sodium: 143 mmol/L (ref 135–145)

## 2021-09-07 LAB — PROCALCITONIN: Procalcitonin: 0.76 ng/mL

## 2021-09-07 MED ORDER — PANTOPRAZOLE SODIUM 40 MG PO TBEC
40.0000 mg | DELAYED_RELEASE_TABLET | Freq: Two times a day (BID) | ORAL | Status: DC
  Administered 2021-09-07 – 2021-09-09 (×4): 40 mg via ORAL
  Filled 2021-09-07 (×4): qty 1

## 2021-09-07 MED ORDER — ACETAMINOPHEN 500 MG PO TABS: 1000.0000 mg | ORAL_TABLET | Freq: Three times a day (TID) | ORAL | Status: DC | PRN

## 2021-09-07 MED ORDER — POTASSIUM CHLORIDE 20 MEQ PO PACK
40.0000 meq | PACK | Freq: Once | ORAL | Status: AC
  Administered 2021-09-07: 40 meq via ORAL
  Filled 2021-09-07: qty 2

## 2021-09-07 MED ORDER — IOHEXOL 300 MG/ML  SOLN
100.0000 mL | Freq: Once | INTRAMUSCULAR | Status: AC | PRN
  Administered 2021-09-07: 100 mL via INTRAVENOUS

## 2021-09-07 MED ORDER — ACETAMINOPHEN 325 MG PO TABS
650.0000 mg | ORAL_TABLET | Freq: Four times a day (QID) | ORAL | Status: DC | PRN
Start: 2021-09-07 — End: 2021-09-09

## 2021-09-07 MED ORDER — ACETAMINOPHEN 650 MG RE SUPP
650.0000 mg | Freq: Four times a day (QID) | RECTAL | Status: DC | PRN
  Administered 2021-09-07: 650 mg via RECTAL
  Filled 2021-09-07: qty 1

## 2021-09-07 MED ORDER — IOHEXOL 9 MG/ML PO SOLN: 500.0000 mL | ORAL | Status: AC

## 2021-09-07 NOTE — Assessment & Plan Note (Signed)
--  rectal temp to 103 on day 4 of Unasyn.  Top DDx aspiration pneumonitis, but will add more workup Plan: --blood cx --procal --CT c/a/p

## 2021-09-07 NOTE — TOC Initial Note (Signed)
Transition of Care [facility]) - Initial/Assessment Note    Patient Details  Name: Parker Chambers MRN: [id number] Date of Birth: 1950/05/04  Transition of [facility]) CM/SW Contact:    Chapman Fitch, RN Phone Number: 09/07/2021, 3:36 PM  Clinical Narrative:                    Patient from LTC at [facility] private pay.  VM left for sister in law to update Plans for patient to be discharge back to [facility] tomorrow  Patient bedbound at base line.  Will Need EMS transport  Cala Bradford at [facility] notified Fl2 sent for signature      Patient Goals and CMS Choice        Expected Discharge Plan and Services                                                Prior Living Arrangements/Services                       Activities of Daily Living      Permission Sought/Granted                  Emotional Assessment              Admission diagnosis:  Aspiration pneumonia (HCC) [J69.0] Altered mental status, unspecified altered mental status type [R41.82] Aspiration pneumonia, unspecified aspiration pneumonia type, unspecified laterality, unspecified part of lung (HCC) [J69.0] Sepsis with acute hypoxic respiratory failure without septic shock, due to unspecified organism (HCC) [A41.9, R65.20, J96.01] Patient Active Problem List   Diagnosis Date Noted   Essential hypertension 08/24/2021   Resides in skilled nursing facility 06/11/2020   Herpes zoster without complication    Sepsis (HCC) 08/04/2019   Dementia (HCC) 08/04/2019   Hypothyroidism 08/04/2019   Altered mental status    Aspiration pneumonia (HCC)    Non-traumatic rhabdomyolysis    PCP:  Lauro Regulus, MD Pharmacy:   [facility] [address] [place] - [address] [place] [address] Phone: [phone] Fax: [phone]     Social Determinants of Health (SDOH) Interventions    Readmission Risk Interventions     No data to display

## 2021-09-07 NOTE — Progress Notes (Signed)
Status Change  Notified by [place], NT that the patient had a temp. Temperature was rechecked rectally, revealing a temperature of 103.0. Dr. Fran Lowes and Karen Chafe, RN (charge nurse) were notified. New orders received for CT chest, CT abdomen, respiratory panel, droplet precautions, and tylenol suppository orders were renewed.

## 2021-09-07 NOTE — Progress Notes (Addendum)
Speech Language Pathology Treatment: Dysphagia  Patient Details Name: Parker Chambers MRN: [id number] DOB: 10/15/1950 Today's Date: 09/07/2021 Time: 4765-4650 SLP Time Calculation (min) (ACUTE ONLY): 60 min  Assessment / Plan / Recommendation Clinical Impression  Pt seen today for ongoing assessment of swallowing and toleration of oral diet. He was awake, engaged in the session accepting bites/sips. Noted more verbalization approximations today during discussions re: 2 different TV shows.   Pt appears to present w/ fairly adequate oropharyngeal phase swallowing function w/ no gross oropharyngeal phase dysphagia appreciated; Mildly increased oral phase time w/ certain increased textured meats resulting in mildly increased mastication time b/f clearing orally. Of note, pt exhibited Mild lingual and UE tremors and jerky movements at baseline during volitional tasks and when startled.  He exhibited inconsistent confusion during oral prep acceptance of boluses, but this subsided as po trials continued. Suspect impact from Cognitive decline and unknown feeder.  He required Mod verbal/visual/tactile cues for follow through w/ tasks such as holding his own Cup for drinking and bolus acceptance initially d/t baseline Dementia/Cognitive decline. ANY Cognitive decline can impact overall awareness/timing of swallow and safety during po tasks which increases risk for aspiration, choking. Pt's risk for aspiration can be reduced when following general aspiration precautions and using the modified diet consistency of Mech Soft foods as he does at baseline(dysphagia level 3 diet w/ thin liquids is Baseline at the NH per chart/Wife's report).    Suspect pt might have risk for aspiration of REFLUX material; he has Baseline of REFLUX s/s per Family report of chronic "PHLEGM" for "2 years now"; coughing. Also noted "Small hiatal hernia" and charted Chronic Cough, Phelgm in Outpatient MD notes.   Pt is strongly recommended  to f/u w/ MD/GI to r/o Esophageal dysmotility, and management REFLUX; PPI coverage/medication -- pt has been on Omeprazole 20 mg PPI at NH but currently he is now on 40 mg of a PPI.    Pt required FULL positioning upright in bed; supported head forward w/ a towel roll. PHLEGMY cough noted during positioning. He was then fed po trials of his Lunch meal alternating foods, liquids. PINCHING of straw necessary at times to limit IMPULSIVE drinking behavior in order to lessen risk for aspiration. He consumed trials of thin liquids via Straw, puree, and soft solids w/ no immediate, overt clinical s/s of aspiration noted; no wet vocal quality noted during few words, no immediate cough, and no decline in respiratory effort. O2 sats remained 97-99%. The congested cough w/ MOD sounding PHLEGM was noted 3-4x during and after the meal -- similar to the PHLEGMY cough noted PRIOR to the meal when moving up in bed. Oral phase appeared grossly [facility] for bolus management, mastication, and A-P transfer of po's for swallow. Min increased oral phase time noted w/ the meats(mixed in mashed potatoes and added gravy for better support). Oral clearing achieved w/ all consistencies alternating foods(w/ pudding, applesauce) and when given TIME b/t bites. Again, mild confusion during oral prep initially. Full Feeding Support required but pt helped to hold cup ~3 times.     Recommend continue a Dysphagia level 3/Mech soft diet w/ moistened foods; thin liquids - pinch straw as needed to lessen Impulsive drinking. Recommend general aspiration precautions and Full Feeding Support at meals. Reduce Distractions at meals to allow better focus. General REFLUX precautions strongly recommended to lessen chance for Regurgitation -- rest breaks, smaller/mini frequent meals, do NOT lie down for ~30-60 mins post meals. Pills CRUSHED in Puree.  Recommend  pt f/u w/ GI for assessment to r/o Reflux and/or tx as indicated. CM/NSG updated. NSG to reconsult ST  services if any new needs while admitted. Recommend continued f/u w/ Palliative Care services at his NH for support.     HPI HPI: Pt  is a 71 y.o. 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  Recent vomiting episodes x2 occurred per report. He was also found to be febrile at 102.1.  In the ED, was noted to be in acute respiratory distress with a respiratory rate of 36 and oxygen saturations of 88% on room air.  He was placed on 2 L nasal cannula given IV fluids and Unasyn and his respiratory rate started to improve.  CXR: Shallow inspiration with bibasilar atelectasis. Developing  infiltrate is less likely but not excluded.  Pt is followed by Palliative Care at his living facility. He eats a mech soft diet w/ a "good" appetite; noted BMI.  Last Chest CT Imaging was in 07/2019 indicing probably atelectasis.      SLP Plan  All goals met (pt appears at his Baseline re: swallowing as described by Wife and Caregiver w/ Wife yesterday at BSE.)      Recommendations for follow up therapy are one component of a multi-disciplinary discharge planning process, led by the attending physician.  Recommendations may be updated based on patient status, additional functional criteria and insurance authorization.    Recommendations  Diet recommendations: Dysphagia 3 (mechanical soft);Thin liquid (baseline Diet for him at SNF) Liquids provided via: Cup;Straw (monitor for impulsive drinking behavior) Medication Administration: Crushed with puree (for safer swallowing) Supervision: Staff to assist with self feeding;Full supervision/cueing for compensatory strategies (full feeding support but try to engage pt w/ holding cup) Compensations: Minimize environmental  distractions;Slow rate;Small sips/bites;Lingual sweep for clearance of pocketing;Follow solids with liquid Postural Changes and/or Swallow Maneuvers: Out of bed for meals;Seated upright 90 degrees;Upright 30-60 min after meal                General recommendations:  (Dietician f/u to support; Palliative Care consult for support/education) Oral Care Recommendations: Oral care BID;Oral care before and after PO;Staff/trained caregiver to provide oral care Follow Up Recommendations: No SLP follow up ((expected)) Assistance recommended at discharge: Frequent or constant Supervision/Assistance (baseline -- for feeding; advanced Dementia) SLP Visit Diagnosis: Dysphagia, oral phase (R13.11) (decreased oral prep awareness intermittently; baseline Dementia) Plan: All goals met (pt appears at his Baseline re: swallowing as described by Wife and Caregiver w/ Wife yesterday at BSE.)              Orinda Kenner, [place], [place]; [place] [phone] (ascom) Roselynne Lortz  09/07/2021, 3:50 PM

## 2021-09-07 NOTE — Progress Notes (Signed)
  Progress Note   Patient: Parker Chambers QZE:[id number] DOB: 1951-01-27 DOA: 09/04/2021     3 DOS: the patient was seen and examined on 09/07/2021   Brief hospital course: No notes on file  Assessment and Plan: * Aspiration pneumonia (HCC) --started on Unasyn --passed SLP eval today, but may have silent aspiration during sleep.  SLP also noted frequent regurgitation of likely stomach content. Plan: --cont Unasyn --increase PPI to BID  Persistent fever --rectal temp to 103 on day 4 of Unasyn.  Top DDx aspiration pneumonitis, but will add more workup Plan: --blood cx --procal --CT c/a/p  Resides in skilled nursing facility --return to SNF  Essential hypertension Continue Norvasc  Altered mental status Different from baseline according to the folks at the SNF Likely related to sepsis and aspiration.  Hypothyroidism Continue Synthroid  Dementia (HCC) Presently nonverbal.  Patient is also DNR.  Followed by outpatient palliative care. Plan: Continue Aricept and Namenda. Continue Seroquel, Remeron, and trazodone   Sepsis (HCC) Fever, tachycardia.  Suspect primary diagnosis is aspiration pneumonia        Subjective:  Pt was noted to be coughing up stuff, which SLP believes is stomach content regurgitating.    Pt was found to have temp up to 103 rectal in the afternoon.     Physical Exam:  Constitutional: NAD, awake, kept eyes shut CV: No cyanosis.   RESP: normal respiratory effort, on 4L Extremities: No effusions, edema in BLE SKIN: warm, dry   Data Reviewed:  Family Communication:   Disposition: Status is: Inpatient   Planned Discharge Destination: Skilled nursing facility    Time spent: 50 minutes  Author: Darlin Priestly, MD 09/07/2021 4:28 PM  For on call review [email or web address].

## 2021-09-07 NOTE — NC FL2 (Signed)
[place] MEDICAID FL2 LEVEL OF CARE SCREENING TOOL     IDENTIFICATION  Patient Name: Parker Chambers Birthdate: 08-Feb-1951 Sex: male Admission Date (Current Location): 09/04/2021  [facility] and [place] Number:  Chiropodist and Address:         Provider Number: [id number]  Attending Physician Name and Address:  Darlin Priestly, MD  Relative Name and Phone Number:       Current Level of Care: Hospital Recommended Level of Care: Skilled Nursing Facility Prior Approval Number:    Date Approved/Denied:   PASRR Number: [id number]  Discharge Plan: SNF    Current Diagnoses: Patient Active Problem List   Diagnosis Date Noted   Essential hypertension 08/24/2021   Resides in skilled nursing facility 06/11/2020   Herpes zoster without complication    Sepsis (HCC) 08/04/2019   Dementia (HCC) 08/04/2019   Hypothyroidism 08/04/2019   Altered mental status    Aspiration pneumonia (HCC)    Non-traumatic rhabdomyolysis     Orientation RESPIRATION BLADDER Height & Weight     Self, Time, Situation, Place  O2 (4L [place]) Incontinent, External catheter Weight: 110 kg Height:  6\' 2"  (188 cm)  BEHAVIORAL SYMPTOMS/MOOD NEUROLOGICAL BOWEL NUTRITION STATUS      Continent Diet (dys 3)  AMBULATORY STATUS COMMUNICATION OF NEEDS Skin   Total Care Verbally Normal                       Personal Care Assistance Level of Assistance              Functional Limitations Info             SPECIAL CARE FACTORS FREQUENCY                       Contractures Contractures Info: Not present    Additional Factors Info  Code Status, Allergies Code Status Info: DNR Allergies Info: NKDA           Current Medications (09/07/2021):  This is the current hospital active medication list Current Facility-Administered Medications  Medication Dose Route Frequency Provider Last Rate Last Admin   0.9 %  sodium chloride infusion  250 mL Intravenous PRN 09/09/2021, MD        acetaminophen (TYLENOL) tablet 650 mg  650 mg Oral Q6H PRN Reva Bores, MD       Or   acetaminophen (TYLENOL) suppository 650 mg  650 mg Rectal Q6H PRN Reva Bores, MD   650 mg at 09/05/21 1456   amLODipine (NORVASC) tablet 10 mg  10 mg Oral Daily 09/07/21, MD   10 mg at 09/07/21 0948   Ampicillin-Sulbactam (UNASYN) 3 g in sodium chloride 0.9 % 100 mL IVPB  3 g Intravenous Q6H Bel[place]06/22/23, RPH 200 mL/hr at 09/07/21 0950 3 g at 09/07/21 0950   cholecalciferol (VITAMIN D3) tablet 1,000 Units  1,000 Units Oral Daily 09/09/21, MD   1,000 Units at 09/07/21 0947   docusate sodium (COLACE) capsule 100 mg  100 mg Oral Daily 09/09/21, MD   100 mg at 09/07/21 0948   donepezil (ARICEPT) tablet 10 mg  10 mg Oral QHS 09/09/21, MD   10 mg at 09/06/21 2247   enoxaparin (LOVENOX) injection 55 mg  0.5 mg/kg Subcutaneous Q24H 09/08/21, MD   55 mg at 09/07/21 0951   folic acid (FOLVITE) tablet 1 mg  1 mg Oral  Daily Reva Bores, MD   1 mg at 09/07/21 0947   guaiFENesin (ROBITUSSIN) 100 MG/5ML liquid 5 mL  5 mL Oral Q4H PRN Reva Bores, MD       levothyroxine (SYNTHROID) tablet 125 mcg  125 mcg Oral Q0600 Reva Bores, MD   125 mcg at 09/07/21 0500   loratadine (CLARITIN) tablet 10 mg  10 mg Oral Daily Reva Bores, MD   10 mg at 09/07/21 0947   memantine (NAMENDA) tablet 10 mg  10 mg Oral BID Reva Bores, MD   10 mg at 09/07/21 0947   metoprolol tartrate (LOPRESSOR) injection 5 mg  5 mg Intravenous Q6H PRN Reva Bores, MD       mirtazapine (REMERON) tablet 30 mg  30 mg Oral QHS Reva Bores, MD   30 mg at 09/06/21 2247   Oral care mouth rinse  15 mL Mouth Rinse 4 times per day Darlin Priestly, MD   15 mL at 09/07/21 1213   Oral care mouth rinse  15 mL Mouth Rinse PRN Darlin Priestly, MD       pantoprazole (PROTONIX) EC tablet 40 mg  40 mg Oral Daily Reva Bores, MD   40 mg at 09/07/21 0948   QUEtiapine (SEROQUEL) tablet 125 mg  125 mg Oral QHS Reva Bores, MD   125  mg at 09/06/21 2247   sodium chloride flush (NS) 0.9 % injection 3 mL  3 mL Intravenous Q12H Reva Bores, MD   3 mL at 09/06/21 2300   sodium chloride flush (NS) 0.9 % injection 3 mL  3 mL Intravenous PRN Reva Bores, MD       traZODone (DESYREL) tablet 50 mg  50 mg Oral QHS Reva Bores, MD   50 mg at 09/06/21 2248     Discharge Medications: Please see discharge summary for a list of discharge medications.  Relevant Imaging Results:  Relevant Lab Results:   Additional Information SSN: [id number]  Chapman Fitch, RN

## 2021-09-08 DIAGNOSIS — J690 Pneumonitis due to inhalation of food and vomit: Secondary | ICD-10-CM | POA: Diagnosis not present

## 2021-09-08 LAB — BASIC METABOLIC PANEL
Anion gap: 9 (ref 5–15)
BUN: 11 mg/dL (ref 8–23)
CO2: 30 mmol/L (ref 22–32)
Calcium: 8.3 mg/dL — ABNORMAL LOW (ref 8.9–10.3)
Chloride: 103 mmol/L (ref 98–111)
Creatinine, Ser: 0.69 mg/dL (ref 0.61–1.24)
GFR, Estimated: >60 mL/min (ref >60)
Glucose, Bld: 95 mg/dL (ref 70–99)
Potassium: 3.2 mmol/L — ABNORMAL LOW (ref 3.5–5.1)
Sodium: 142 mmol/L (ref 135–145)

## 2021-09-08 LAB — CBC
HCT: 41.9 % (ref 39.0–52.0)
Hemoglobin: 13.3 g/dL (ref 13.0–17.0)
MCH: 28.4 pg (ref 26.0–34.0)
MCHC: 31.7 g/dL (ref 30.0–36.0)
MCV: 89.5 fL (ref 80.0–100.0)
Platelets: 205 10*3/uL (ref 150–400)
RBC: 4.68 MIL/uL (ref 4.22–5.81)
RDW: 12.9 % (ref 11.5–15.5)
WBC: 6.5 10*3/uL (ref 4.0–10.5)
nRBC: 0 % (ref 0.0–0.2)

## 2021-09-08 LAB — MAGNESIUM: Magnesium: 2.2 mg/dL (ref 1.7–2.4)

## 2021-09-08 MED ORDER — POTASSIUM CHLORIDE 10 MEQ/100ML IV SOLN
10.0000 meq | INTRAVENOUS | Status: AC
  Administered 2021-09-08: 10 meq via INTRAVENOUS
  Filled 2021-09-08 (×2): qty 100

## 2021-09-08 MED ORDER — POTASSIUM CHLORIDE 20 MEQ PO PACK
40.0000 meq | PACK | Freq: Once | ORAL | Status: AC
  Administered 2021-09-08: 40 meq via ORAL
  Filled 2021-09-08: qty 2

## 2021-09-08 MED ORDER — POTASSIUM CHLORIDE 10 MEQ/100ML IV SOLN
10.0000 meq | INTRAVENOUS | Status: AC
  Administered 2021-09-08 (×2): 10 meq via INTRAVENOUS
  Filled 2021-09-08: qty 100

## 2021-09-08 NOTE — Care Management Important Message (Signed)
Important Message  Patient Details  Name: Parker Chambers MRN: [id number] Date of Birth: 27-Apr-1950   Medicare Important Message Given:  Other (see comment)  Left a message on the house phone for Mrs. Tobin to return my call at her convenience to review the Important Message from Medicare. Will await a return call.   Olegario Messier A Yenni Carra 09/08/2021, 11:21 AM

## 2021-09-08 NOTE — Plan of Care (Signed)
Cognitive delays affecting ability to be educated  Problem: Activity: Goal: Ability to tolerate increased activity will improve Outcome: Not Progressing   Problem: Clinical Measurements: Goal: Ability to maintain a body temperature in the normal range will improve Outcome: Not Progressing   Problem: Respiratory: Goal: Ability to maintain adequate ventilation will improve Outcome: Not Progressing Goal: Ability to maintain a clear airway will improve Outcome: Not Progressing   Problem: Education: Goal: Knowledge of General Education information will improve Description: Including pain rating scale, medication(s)/side effects and non-pharmacologic comfort measures Outcome: Not Progressing   Problem: Health Behavior/Discharge Planning: Goal: Ability to manage health-related needs will improve Outcome: Not Progressing   Problem: Clinical Measurements: Goal: Ability to maintain clinical measurements within normal limits will improve Outcome: Not Progressing Goal: Will remain free from infection Outcome: Not Progressing Goal: Diagnostic test results will improve Outcome: Not Progressing Goal: Respiratory complications will improve Outcome: Not Progressing Goal: Cardiovascular complication will be avoided Outcome: Not Progressing   Problem: Activity: Goal: Risk for activity intolerance will decrease Outcome: Not Progressing   Problem: Nutrition: Goal: Adequate nutrition will be maintained Outcome: Not Progressing   Problem: Coping: Goal: Level of anxiety will decrease Outcome: Not Progressing   Problem: Elimination: Goal: Will not experience complications related to bowel motility Outcome: Not Progressing Goal: Will not experience complications related to urinary retention Outcome: Not Progressing   Problem: Pain Managment: Goal: General experience of comfort will improve Outcome: Not Progressing   Problem: Safety: Goal: Ability to remain free from injury will  improve Outcome: Not Progressing   Problem: Skin Integrity: Goal: Risk for impaired skin integrity will decrease Outcome: Not Progressing

## 2021-09-08 NOTE — Care Management Important Message (Signed)
Important Message  Patient Details  Name: Parker Chambers MRN: [id number] Date of Birth: January 22, 1951   Medicare Important Message Given:  Yes  Mrs. Kalee Mcclenathan returned my call and she is in agreement with her husband returning to [facility] long-term [facility].  I thanked her for her time.    Olegario Messier A Tiffiny Worthy 09/08/2021, 2:47 PM

## 2021-09-08 NOTE — Progress Notes (Signed)
  Progress Note   Patient: Parker Chambers [id number] DOB: 05-Apr-1950 DOA: 09/04/2021     4 DOS: the patient was seen and examined on 09/08/2021   Brief hospital course: No notes on file  Assessment and Plan: * Aspiration pneumonia (HCC) --started on Unasyn --may have silent aspiration during sleep.  SLP also noted frequent regurgitation of likely stomach content. Plan: --cont Unasyn day 5 of 5 --cont PPI BID  Rhinovirus infection --RVP pos for Rhinovirus / Enterovirus, likely the source of pt's high fever. --supportive care  Resides in skilled nursing facility --return to SNF  Essential hypertension Continue Norvasc  Altered mental status Different from baseline according to the folks at the SNF Likely related to sepsis and aspiration.  Hypothyroidism Continue Synthroid  Dementia (HCC) Presently nonverbal.  Patient is also DNR.  Followed by outpatient palliative care. Plan: Continue Aricept and Namenda. Continue Seroquel, Remeron, and trazodone   Sepsis (HCC) Fever, tachycardia.  Suspect primary diagnosis is aspiration pneumonia        Subjective:  RVP pos for Rhinovirus / Enterovirus, which likely explained pt's high fever.  Continues to have frequent strong coughs to clear secretion.     Physical Exam:  Constitutional: NAD, more alert today, eyes open, said a few words but didn't answer questions directly HEENT: conjunctivae and lids normal, EOMI CV: No cyanosis.   RESP: normal respiratory effort, on 4L Extremities: No effusions, edema in BLE SKIN: warm, dry   Data Reviewed:  Family Communication:   Disposition: Status is: Inpatient   Planned Discharge Destination: Skilled nursing facility    Time spent: 35 minutes  Author: Darlin Priestly, MD 09/08/2021 3:58 PM  For on call review [email or web address].

## 2021-09-09 DIAGNOSIS — J690 Pneumonitis due to inhalation of food and vomit: Secondary | ICD-10-CM | POA: Diagnosis not present

## 2021-09-09 LAB — MAGNESIUM: Magnesium: 2.1 mg/dL (ref 1.7–2.4)

## 2021-09-09 LAB — CULTURE, BLOOD (ROUTINE X 2)
Culture: NO GROWTH
Culture: NO GROWTH
Special Requests: ADEQUATE
Special Requests: ADEQUATE

## 2021-09-09 LAB — CBC
HCT: 39.2 % (ref 39.0–52.0)
Hemoglobin: 12.7 g/dL — ABNORMAL LOW (ref 13.0–17.0)
MCH: 28.3 pg (ref 26.0–34.0)
MCHC: 32.4 g/dL (ref 30.0–36.0)
MCV: 87.3 fL (ref 80.0–100.0)
Platelets: 243 10*3/uL (ref 150–400)
RBC: 4.49 MIL/uL (ref 4.22–5.81)
RDW: 12.7 % (ref 11.5–15.5)
WBC: 7 10*3/uL (ref 4.0–10.5)
nRBC: 0 % (ref 0.0–0.2)

## 2021-09-09 LAB — BASIC METABOLIC PANEL
Anion gap: 9 (ref 5–15)
BUN: 11 mg/dL (ref 8–23)
CO2: 27 mmol/L (ref 22–32)
Calcium: 8.5 mg/dL — ABNORMAL LOW (ref 8.9–10.3)
Chloride: 105 mmol/L (ref 98–111)
Creatinine, Ser: 0.82 mg/dL (ref 0.61–1.24)
GFR, Estimated: >60 mL/min (ref >60)
Glucose, Bld: 98 mg/dL (ref 70–99)
Potassium: 3.2 mmol/L — ABNORMAL LOW (ref 3.5–5.1)
Sodium: 141 mmol/L (ref 135–145)

## 2021-09-09 MED ORDER — POTASSIUM CHLORIDE CRYS ER 20 MEQ PO TBCR: 40.0000 meq | EXTENDED_RELEASE_TABLET | Freq: Every day | ORAL | 0 refills | Status: AC

## 2021-09-09 MED ORDER — POTASSIUM CHLORIDE CRYS ER 20 MEQ PO TBCR
40.0000 meq | EXTENDED_RELEASE_TABLET | Freq: Once | ORAL | Status: AC
  Administered 2021-09-09: 40 meq via ORAL
  Filled 2021-09-09: qty 2

## 2021-09-09 MED ORDER — POTASSIUM CHLORIDE 10 MEQ/100ML IV SOLN
10.0000 meq | Freq: Once | INTRAVENOUS | Status: AC
  Administered 2021-09-09: 10 meq via INTRAVENOUS
  Filled 2021-09-09: qty 100

## 2021-09-09 NOTE — TOC Transition Note (Signed)
Transition of Care [facility]) - CM/SW Discharge Note   Patient Details  Name: Parker Chambers MRN: [id number] Date of Birth: [birth year]  [facility] CM/SW Contact:  Parker Fitch, RN Phone Number: 09/09/2021, 12:16 PM   Clinical Narrative:        Patient will DC WG:[id number] Anticipated DC date: 09/09/21  Family notified:Sister in law Transport by:EMS  Per MD patient ready for DC to . RN, patient , patient's family, and facility notified of DC. Discharge Summary sent to facility. RN given number for report. DC packet on chart. Ambulance transport requested for patient.  TOC signing off.  Parker Chambers [facility] [phone]      Patient Goals and CMS Choice        Discharge Placement                       Discharge Plan and Services                                     Social Determinants of Health (SDOH) Interventions     Readmission Risk Interventions     No data to display

## 2021-09-09 NOTE — Discharge Summary (Addendum)
Physician Discharge Summary   Parker Chambers  Chambers DOB: Dec 19, 1950  [id number]  PCP: Parker Ruths, MD  Admit date: 09/04/2021 Discharge date: 09/09/2021  Admitted From: SNF Disposition:  SNF CODE STATUS: DNR  Hospital Course:  For full details, please see H&P, progress notes, consult notes and ancillary notes.  Briefly,  Parker Chambers is a 71 y.o. Chambers with medical history significant of hypertension, hypothyroidism, advanced dementia who presented from his skilled nursing facility with likely aspiration.    He was noted to have altered mental status where he was no longer responding to voice though he is always nonverbal.  He was also found to be febrile at 102.1.  He threw up twice.  In the ED was noted to be in acute respiratory distress with a respiratory rate of 36 and oxygen saturations of 88% on room air.  He was placed on 2 L nasal cannula given IV fluids and Unasyn and his respiratory rate started to improve.  A discussion was had with his caregivers by the EDP and they wanted to have him admitted with attempts to see how IV antibiotics went that he is to remain DNR.  * Aspiration pneumonia ([place]) --CT chest showed Left lower lobe and lingular peribronchovascular patchy airspace opacities suggestive of aspiration pneumonia. --may have silent aspiration during sleep.  SLP also noted frequent regurgitation of likely stomach content. --SLP eval found pt tolerates well dys 3 diet with thin fluid while awake.  Recommend Pills in Puree for safer swallowing if indicated.  --pt completed 5 days of Unasyn. --cont PPI    Rhinovirus infection --RVP pos for Rhinovirus / Enterovirus, likely the source of pt's high fever. --supportive care  Acute hypoxemic respiratory failure --2/2 recurrent aspiration, shallow inspiration with bibasilar atelectasis.   --Pt is discharged on 4L O2, to be weaned down as tolerated as outpatient.  Essential hypertension Continue Norvasc    Altered mental status Different from baseline according to the folks at the SNF Likely related to sepsis and aspiration. Pt was more alert, reactive, and opening his eyes more prior to discharge.   Hypothyroidism Continue Synthroid   Advanced Dementia ([place]) Can say a few words on his own.  Followed by outpatient palliative care. Continue home Aricept and Namenda. Continue home Seroquel, Remeron, and trazodone   Hypokalemia --discharged on potassium 40 mEq daily for 7 days.  Please check potassium level in 1 week to determine need for further supplementation.   Sepsis ([place]) Fever, tachycardia.  Suspect primary diagnosis is aspiration pneumonia    Discharge Diagnoses:  Principal Problem:   Aspiration pneumonia ([place]) Active Problems:   Sepsis ([place])   Dementia ([place])   Hypothyroidism   Altered mental status   Essential hypertension   Resides in skilled nursing facility   Rhinovirus infection   30 Day Unplanned Readmission Risk Score    Flowsheet Row ED to Hosp-Admission (Current) from 09/04/2021 in [facility]  30 Day Unplanned Readmission Risk Score (%) 18.01 Filed at 09/09/2021 0801       This score is the patient's risk of an unplanned readmission within 30 days of being discharged (0 -100%). The score is based on dignosis, age, lab data, medications, orders, and past utilization.   Low:  0-14.9   Medium: 15-21.9   High: 22-29.9   Extreme: 30 and above         Discharge Instructions:  Allergies as of 09/09/2021   No Known Allergies  Medication List     STOP taking these medications    LORazepam 0.5 MG tablet Commonly known as: ATIVAN       TAKE these medications    acetaminophen 650 MG CR tablet Commonly known as: TYLENOL Take 650 mg by mouth every 8 (eight) hours as needed for pain.   acetaminophen 650 MG suppository Commonly known as: TYLENOL Place 650 mg rectally every 4 (four) hours as needed.    amLODipine 10 MG tablet Commonly known as: NORVASC Take 1 tablet (10 mg total) by mouth daily.   Cholecalciferol 25 MCG (1000 UT) tablet Take 1,000 Units by mouth daily.   DERMACLOUD EX Apply 1 Application topically daily as needed.   docusate sodium 100 MG capsule Commonly known as: COLACE Take 100 mg by mouth daily.   donepezil 10 MG tablet Commonly known as: ARICEPT Take 10 mg by mouth at bedtime.   folic acid 1 MG tablet Commonly known as: FOLVITE Take 1 mg by mouth daily.   guaiFENesin 100 MG/5ML liquid Commonly known as: ROBITUSSIN Take 5 mLs by mouth every 4 (four) hours as needed for cough or to loosen phlegm.   levothyroxine 125 MCG tablet Commonly known as: SYNTHROID Take 125 mcg by mouth daily before breakfast.   loratadine 10 MG tablet Commonly known as: CLARITIN Take 10 mg by mouth daily.   memantine 10 MG tablet Commonly known as: NAMENDA Take 10 mg by mouth 2 (two) times daily.   mirtazapine 30 MG tablet Commonly known as: REMERON Take 30 mg by mouth at bedtime.   omeprazole 20 MG capsule Commonly known as: PRILOSEC Take 20 mg by mouth daily.   potassium chloride SA 20 MEQ tablet Commonly known as: KLOR-CON M Take 2 tablets (40 mEq total) by mouth daily for 7 days.   QUEtiapine 100 MG tablet Commonly known as: SEROQUEL Take 100 mg by mouth at bedtime.   QUEtiapine 25 MG tablet Commonly known as: SEROQUEL Take 25 mg by mouth at bedtime.   traZODone 50 MG tablet Commonly known as: DESYREL Take 50 mg by mouth at bedtime.         Follow-up Information     Lauro Regulus, MD Follow up in 1 week(s).   Specialty: Internal Medicine Contact information: [address] [facility] [place] [address] [phone]                 No Known Allergies   The results of significant diagnostics from this hospitalization (including imaging, microbiology, ancillary and laboratory) are listed below for  reference.   Consultations:   Procedures/Studies: CT CHEST ABDOMEN PELVIS W CONTRAST  Result Date: 09/07/2021 CLINICAL DATA:  persistent fever. Passed SLP eval today, but may have silent aspiration during sleep. SLP also noted frequent regurgitation of likely stomach content. EXAM: CT CHEST, ABDOMEN, AND PELVIS WITH CONTRAST TECHNIQUE: Multidetector CT imaging of the chest, abdomen and pelvis was performed following the standard protocol during bolus administration of intravenous contrast. RADIATION DOSE REDUCTION: This exam was performed according to the departmental dose-optimization program which includes automated exposure control, adjustment of the mA and/or kV according to patient size and/or use of iterative reconstruction technique. CONTRAST:  OMNIPAQUE IOHEXOL 300 MG/ML  SOLN COMPARISON:  CT pelvis 08/04/2019 FINDINGS: CT CHEST FINDINGS Cardiovascular: Normal heart size. No significant pericardial effusion. The thoracic aorta is normal in caliber. No atherosclerotic plaque of the thoracic aorta. Two vessel coronary artery calcifications. Mediastinum/Nodes: No enlarged mediastinal, hilar, or  axillary lymph nodes. Thyroid gland, trachea, and esophagus demonstrate no significant findings. Tiny hiatal hernia. Lungs/Pleura: Right apical 1.3 x 1.3 cm pulmonary nodule. Left lower lobe and lingular peribronchovascular patchy airspace opacities. No pulmonary nodule. No pulmonary mass. Trace left pleural effusion. No pneumothorax. Musculoskeletal: No chest wall abnormality. No suspicious lytic or blastic osseous lesions. No acute displaced fracture. Multilevel degenerative changes of the spine. CT ABDOMEN PELVIS FINDINGS Hepatobiliary: No focal liver abnormality. No gallstones, gallbladder wall thickening, or pericholecystic fluid. No biliary dilatation. Pancreas: No focal lesion. Normal pancreatic contour. No surrounding inflammatory changes. No main pancreatic ductal dilatation. Spleen: Normal in size  without focal abnormality. Adrenals/Urinary Tract: No adrenal nodule bilaterally. Bilateral kidneys enhance symmetrically. No hydronephrosis. No hydroureter. The urinary bladder is unremarkable. On delayed imaging, there is no urothelial wall thickening and there are no filling defects in the opacified portions of the bilateral collecting systems or ureters. Stomach/Bowel: Stomach is within normal limits. No evidence of bowel wall thickening or dilatation. Appendix appears normal. Vascular/Lymphatic: No abdominal aorta or iliac aneurysm. Severe atherosclerotic plaque of the aorta and its branches. No abdominal, pelvic, or inguinal lymphadenopathy. Reproductive: The prostate is enlarged measuring up to 5.5 cm. Other: No intraperitoneal free fluid. No intraperitoneal free gas. No organized fluid collection. Musculoskeletal: No abdominal wall hernia or abnormality. No suspicious lytic or blastic osseous lesions. No acute displaced fracture. Multilevel degenerative changes of the spine. IMPRESSION: 1. Left lower lobe and lingular peribronchovascular patchy airspace opacities suggestive of aspiration pneumonia. 2. Trace left pleural effusion. 3. No acute intra-abdominal or intrapelvic abnormality. Other imaging findings of potential clinical significance: 1. Small hiatal hernia.  Prostatomegaly. 2. Aortic Atherosclerosis (ICD10-I70.0). 3. A 1.3 cm right apical pulmonary nodule. Consider one of the following in 3 months for both low-risk and high-risk individuals: (a) repeat chest CT, (b) follow-up PET-CT, or (c) tissue sampling. This recommendation follows the consensus statement: Guidelines for Management of Incidental Pulmonary Nodules Detected on CT Images: From the Fleischner Society 2017; Radiology 2017; 284:228-243. Electronically Signed   By: Tish Frederickson M.D.   On: 09/07/2021 21:16   CT Head Wo Contrast  Result Date: 09/04/2021 CLINICAL DATA:  Altered mental status. EXAM: CT HEAD WITHOUT CONTRAST  TECHNIQUE: Contiguous axial images were obtained from the base of the skull through the vertex without intravenous contrast. RADIATION DOSE REDUCTION: This exam was performed according to the departmental dose-optimization program which includes automated exposure control, adjustment of the mA and/or kV according to patient size and/or use of iterative reconstruction technique. COMPARISON:  Head CT dated 08/04/2019. FINDINGS: Brain: Moderate age-related atrophy and advanced chronic microvascular ischemic changes. There is no acute intracranial hemorrhage. No mass effect or midline shift. No extra-axial fluid collection. Vascular: No hyperdense vessel or unexpected calcification. Skull: Normal. Negative for fracture or focal lesion. Sinuses/Orbits: There is partial opacification of several ethmoid air cells and right maxillary sinus. The mastoid air cells are clear. Other: None IMPRESSION: 1. No acute intracranial pathology. 2. Moderate age-related atrophy and advanced chronic microvascular ischemic changes. 3. Paranasal sinus disease. Electronically Signed   By: Elgie Collard M.D.   On: 09/04/2021 22:06   DG Chest Portable 1 View  Result Date: 09/04/2021 CLINICAL DATA:  Sepsis.  Aspiration. EXAM: PORTABLE CHEST 1 VIEW COMPARISON:  Chest radiograph dated 08/04/2019 and CT dated 08/04/2019 FINDINGS: There is shallow inspiration. Bibasilar streaky densities, left greater right, likely atelectasis. Developing infiltrate is less likely but not excluded. Clinical correlation is recommended. No pleural effusion or pneumothorax. The cardiac  silhouette is within limits. Atherosclerotic calcification of the aorta. Degenerative changes of the spine no acute osseous pathology. IMPRESSION: Shallow inspiration with bibasilar atelectasis. Developing infiltrate is less likely but not excluded. Electronically Signed   By: Elgie Collard M.D.   On: 09/04/2021 21:40      Labs: BNP (last 3 results) No results for  input(s): "BNP" in the last 8760 hours. Basic Metabolic Panel: Recent Labs  Lab 09/05/21 0345 09/06/21 0544 09/07/21 0509 09/08/21 0509 09/09/21 0540  NA 144 141 143 142 141  K 3.5 3.4* 3.1* 3.2* 3.2*  CL 107 104 106 103 105  CO2 32 31 30 30 27   GLUCOSE 102* 93 89 95 98  BUN 11 10 15 11 11   CREATININE 1.02 0.89 0.91 0.69 0.82  CALCIUM 8.5* 8.6* 8.4* 8.3* 8.5*  MG  --  2.0 2.0 2.2 2.1   Liver Function Tests: Recent Labs  Lab 09/04/21 2104  AST 16  ALT 14  ALKPHOS 65  BILITOT 0.7  PROT 5.8*  ALBUMIN 3.2*   Recent Labs  Lab 09/04/21 2104  LIPASE 38   No results for input(s): "AMMONIA" in the last 168 hours. CBC: Recent Labs  Lab 09/04/21 2104 09/05/21 0345 09/06/21 0544 09/07/21 0509 09/08/21 0509 09/09/21 0540  WBC 10.7* 10.9* 10.8* 7.1 6.5 7.0  NEUTROABS 9.1*  --   --   --   --   --   HGB 13.2 13.9 13.8 13.1 13.3 12.7*  HCT 41.4 44.0 43.9 41.1 41.9 39.2  MCV 89.2 91.1 90.9 89.7 89.5 87.3  PLT 218 216 208 190 205 243   Cardiac Enzymes: No results for input(s): "CKTOTAL", "CKMB", "CKMBINDEX", "TROPONINI" in the last 168 hours. BNP: Invalid input(s): "POCBNP" CBG: No results for input(s): "GLUCAP" in the last 168 hours. D-Dimer No results for input(s): "DDIMER" in the last 72 hours. Hgb A1c No results for input(s): "HGBA1C" in the last 72 hours. Lipid Profile No results for input(s): "CHOL", "HDL", "LDLCALC", "TRIG", "CHOLHDL", "LDLDIRECT" in the last 72 hours. Thyroid function studies No results for input(s): "TSH", "T4TOTAL", "T3FREE", "THYROIDAB" in the last 72 hours.  Invalid input(s): "FREET3" Anemia work up No results for input(s): "VITAMINB12", "FOLATE", "FERRITIN", "TIBC", "IRON", "RETICCTPCT" in the last 72 hours. Urinalysis    Component Value Date/Time   COLORURINE YELLOW (A) 09/04/2021 2104   APPEARANCEUR CLEAR (A) 09/04/2021 2104   LABSPEC 1.008 09/04/2021 2104   PHURINE 6.0 09/04/2021 2104   GLUCOSEU NEGATIVE 09/04/2021 2104    HGBUR NEGATIVE 09/04/2021 2104   BILIRUBINUR NEGATIVE 09/04/2021 2104   KETONESUR NEGATIVE 09/04/2021 2104   PROTEINUR NEGATIVE 09/04/2021 2104   NITRITE NEGATIVE 09/04/2021 2104   LEUKOCYTESUR NEGATIVE 09/04/2021 2104   Sepsis Labs Recent Labs  Lab 09/06/21 0544 09/07/21 0509 09/08/21 0509 09/09/21 0540  WBC 10.8* 7.1 6.5 7.0   Microbiology Recent Results (from the past 240 hour(s))  Culture, blood (routine x 2)     Status: None   Collection Time: 09/04/21  9:04 PM   Specimen: BLOOD  Result Value Ref Range Status   Specimen Description BLOOD LEFT HAND  Final   Special Requests   Final    BOTTLES DRAWN AEROBIC AND ANAEROBIC Blood Culture adequate volume   Culture   Final    NO GROWTH 5 DAYS Performed at [facility], [address], [place], [address] [place]    Report Status 09/09/2021 FINAL  Final  SARS Coronavirus 2 by RT PCR (hospital order, performed in [facility] hospital  lab) *cepheid single result test* Anterior Nasal Swab     Status: None   Collection Time: 09/04/21  9:04 PM   Specimen: Anterior Nasal Swab  Result Value Ref Range Status   SARS Coronavirus 2 by RT PCR NEGATIVE NEGATIVE Final    Comment: (NOTE) SARS-CoV-2 target nucleic acids are NOT DETECTED.  The SARS-CoV-2 RNA is generally detectable in upper and lower respiratory specimens during the acute phase of infection. The lowest concentration of SARS-CoV-2 viral copies this assay can detect is 250 copies / mL. A negative result does not preclude SARS-CoV-2 infection and should not be used as the sole basis for treatment or other patient management decisions.  A negative result may occur with improper specimen collection / handling, submission of specimen other than nasopharyngeal swab, presence of viral mutation(s) within the areas targeted by this assay, and inadequate number of viral copies (<250 copies / mL). A negative result must be combined with clinical observations, patient  history, and epidemiological information.  Fact Sheet for Patients:   https://www.patel.info/  Fact Sheet for Healthcare Providers: https://hall.com/  This test is not yet approved or  cleared by the [place] FDA and has been authorized for detection and/or diagnosis of SARS-CoV-2 by FDA under an Emergency Use Authorization (EUA).  This EUA will remain in effect (meaning this test can be used) for the duration of the COVID-19 declaration under Section 564(b)(1) of the Act, 21 U.S.C. section 360bbb-3(b)(1), unless the authorization is terminated or revoked sooner.  Performed at [facility], [address] [place], [place] [address]   Culture, blood (routine x 2)     Status: None   Collection Time: 09/04/21  9:12 PM   Specimen: BLOOD  Result Value Ref Range Status   Specimen Description BLOOD RIGTH [facility]  Final   Special Requests   Final    BOTTLES DRAWN AEROBIC AND ANAEROBIC Blood Culture adequate volume   Culture   Final    NO GROWTH 5 DAYS Performed at [facility], [address] [place], [place] [address]    Report Status 09/09/2021 FINAL  Final  Culture, blood (Routine X 2) w Reflex to ID Panel     Status: None (Preliminary result)   Collection Time: 09/07/21  5:40 PM   Specimen: BLOOD  Result Value Ref Range Status   Specimen Description BLOOD RIGHT WRIST  Final   Special Requests   Final    BOTTLES DRAWN AEROBIC AND ANAEROBIC Blood Culture adequate volume   Culture   Final    NO GROWTH 2 DAYS Performed at [facility], [address], [place], [place] [address]    Report Status PENDING  Incomplete  Culture, blood (Routine X 2) w Reflex to ID Panel     Status: None (Preliminary result)   Collection Time: 09/07/21  5:40 PM   Specimen: BLOOD  Result Value Ref Range Status   Specimen Description BLOOD LEFT HAND  Final   Special Requests   Final    BOTTLES DRAWN AEROBIC AND ANAEROBIC Blood  Culture adequate volume   Culture   Final    NO GROWTH 2 DAYS Performed at [facility], [address] [place], [place] [address]    Report Status PENDING  Incomplete  Respiratory (~20 pathogens) panel by PCR     Status: Abnormal   Collection Time: 09/07/21  6:14 PM   Specimen: Nasopharyngeal Swab; Respiratory  Result Value Ref Range Status   Adenovirus NOT DETECTED NOT DETECTED  Final   Coronavirus 229E NOT DETECTED NOT DETECTED Final    Comment: (NOTE) The Coronavirus on the Respiratory Panel, DOES NOT test for the novel  Coronavirus (2019 nCoV)    Coronavirus HKU1 NOT DETECTED NOT DETECTED Final   Coronavirus NL63 NOT DETECTED NOT DETECTED Final   Coronavirus OC43 NOT DETECTED NOT DETECTED Final   Metapneumovirus NOT DETECTED NOT DETECTED Final   Rhinovirus / Enterovirus DETECTED (A) NOT DETECTED Final   Influenza A NOT DETECTED NOT DETECTED Final   Influenza B NOT DETECTED NOT DETECTED Final   Parainfluenza Virus 1 NOT DETECTED NOT DETECTED Final   Parainfluenza Virus 2 NOT DETECTED NOT DETECTED Final   Parainfluenza Virus 3 NOT DETECTED NOT DETECTED Final   Parainfluenza Virus 4 NOT DETECTED NOT DETECTED Final   Respiratory Syncytial Virus NOT DETECTED NOT DETECTED Final   Bordetella pertussis NOT DETECTED NOT DETECTED Final   Bordetella Parapertussis NOT DETECTED NOT DETECTED Final   Chlamydophila pneumoniae NOT DETECTED NOT DETECTED Final   Mycoplasma pneumoniae NOT DETECTED NOT DETECTED Final    Comment: Performed at [facility], [address], [place], [place] [address]     Total time spend on discharging this patient, including the last patient exam, discussing the hospital stay, instructions for ongoing care as it relates to all pertinent caregivers, as well as preparing the medical discharge records, prescriptions, and/or referrals as applicable, is 30 minutes.    Enzo Bi, MD  [facility] 09/09/2021, 9:46 AM

## 2021-09-10 ENCOUNTER — Non-Acute Institutional Stay: Payer: Medicare Other | Admitting: Student

## 2021-09-10 DIAGNOSIS — R1310 Dysphagia, unspecified: Secondary | ICD-10-CM

## 2021-09-10 DIAGNOSIS — Z515 Encounter for palliative care: Secondary | ICD-10-CM

## 2021-09-10 DIAGNOSIS — F02811 Dementia in other diseases classified elsewhere, unspecified severity, with agitation: Secondary | ICD-10-CM

## 2021-09-12 LAB — CULTURE, BLOOD (ROUTINE X 2)
Culture: NO GROWTH
Culture: NO GROWTH
Special Requests: ADEQUATE
Special Requests: ADEQUATE

## 2021-09-14 ENCOUNTER — Telehealth: Payer: Self-pay | Admitting: Student

## 2021-10-08 ENCOUNTER — Non-Acute Institutional Stay: Payer: Medicare Other | Admitting: Student

## 2021-10-08 DIAGNOSIS — R1310 Dysphagia, unspecified: Secondary | ICD-10-CM

## 2021-10-08 DIAGNOSIS — J9601 Acute respiratory failure with hypoxia: Secondary | ICD-10-CM

## 2021-10-08 DIAGNOSIS — Z515 Encounter for palliative care: Secondary | ICD-10-CM

## 2021-10-08 DIAGNOSIS — F02811 Dementia in other diseases classified elsewhere, unspecified severity, with agitation: Secondary | ICD-10-CM

## 2021-10-08 NOTE — Progress Notes (Unsigned)
[facility] Palliative Care Consult Note Telephone: [phone]  Fax: [phone]    Date of encounter: 10/08/21 11:17 AM PATIENT NAME: Parker Chambers Parker Chambers [id number]   [phone] (home)  DOB: 1950/12/30 MRN: [id number] PRIMARY CARE PROVIDER:    Kirk Ruths, MD,  [address] [place] [address] [phone]  REFERRING PROVIDER:   Kirk Ruths, MD [facility] [place],  [place] [address] [phone]  RESPONSIBLE PARTY:    Contact Information     Name Relation Home Work Mobile   Parker Chambers, Parker Chambers Spouse   [phone]   Parker Chambers,Parker Chambers Relative   [phone]        I met face to face with patient and family in the facility. Palliative Care was asked to follow this patient by consultation request of  Kirk Ruths, MD to address advance care planning and complex medical decision making. This is a follow up visit.                                   ASSESSMENT AND PLAN / RECOMMENDATIONS:   Advance Care Planning/Goals of Care: Goals include to maximize quality of life and symptom management. Patient/health care surrogate gave his/her permission to discuss. CODE STATUS: DNR  Met with patient's wife, SIL Parker Chambers and private caregiver. Education provided on Palliative Medicine, his advanced dementia. Family given opportunity to ask questions. Will continue to provide supportive care, monitor for changes and declines. Will refer for hospice evaluation when he meets criteria.   Symptom Management/Plan:  Alzheimer's Dementia-FAST score 7C. Patient is dependent for all adl's including feeding. Monitor for worsening or changes in behaviors. Continues to have good appetite. He is monitored for dysphagia. Discussed possibility of recurrent aspiration and infections. Family would like for PT to evaluate patient again for ROM exercises.    Dysphagia-continue mechanical soft diet with thin liquids; aspiration precautions in place.  Acute hypoxic respiratory failure-secondary to recurrent aspiration, atelectasis. Patient has been weaned back to room air. Monitor for shortness of breath, worsening symptoms.   Follow up Palliative Care Visit: Palliative care will continue to follow for complex medical decision making, advance care planning, and clarification of goals. Return in  weeks or prn.  I spent 40 minutes providing this consultation. More than 50% of the time in this consultation was spent in counseling and care coordination.   PPS: 30%  HOSPICE ELIGIBILITY/DIAGNOSIS: TBD  Chief Complaint: Palliative Medicine follow up visit.   HISTORY OF PRESENT ILLNESS:  Parker Chambers is a 71 y.o. year old male  with Alzheimer's dementia, hypothyroidism, anxiety, depression, hypertension. Patient hospitalized 6/17-6/22/23 due to aspiration pneumonia, rhinovirus, acute hypoxemic respiratory failure, AMS.     Patient resides at [facility] on SNF unit. He is dependent for all adl's. He is out of bed daily to w/c. Mechanical lift is used now primarily for transfers. He has weaned back to room air. Occasional coughing with meals; aspiration precautions in place. Patient able to speak few words. No worsening or changes in behaviors reported.    Patient received sitting in dining room. He speaks one word today and does shake head yes/no. He does not exhibit any signs of pain, discomfort or shortness of breath.   History obtained from review of EMR, discussion with primary team, and interview with family, facility staff/caregiver and/or Parker Chambers.  I  reviewed available labs, medications, imaging, studies and related documents from the EMR.  Records reviewed and summarized above.   ROS  Unable to contribute d/t his dementia.   Physical Exam: Pulse 78, resp 18, sats 95% on room air Constitutional: NAD General: frail appearing EYES: anicteric  sclera, lids intact, no discharge  ENMT: intact hearing, oral mucous membranes moist CV: S1S2, RRR, no LE edema Pulmonary: upper lobes clear, no increased work of breathing, no cough, room air Abdomen: normo-active BS + 4 quadrants, soft and non tender, no ascites GU: deferred MSK: moves all extremities Skin: warm and dry, no rashes or wounds on visible skin Neuro:  + generalized weakness,  A & O to person Psych: non-anxious affect Hem/lymph/immuno: no widespread bruising   Thank you for the opportunity to participate in the care of Parker Chambers.  The palliative care team will continue to follow. Please call our office at [phone] if we can be of additional assistance.   Ezekiel Slocumb, NP   COVID-19 PATIENT SCREENING TOOL Asked and negative response unless otherwise noted:   Have you had symptoms of covid, tested positive or been in contact with someone with symptoms/positive test in the past 5-10 days? No

## 2021-12-03 ENCOUNTER — Non-Acute Institutional Stay: Payer: Medicare Other | Admitting: Student

## 2021-12-03 DIAGNOSIS — Z515 Encounter for palliative care: Secondary | ICD-10-CM

## 2021-12-03 DIAGNOSIS — R1310 Dysphagia, unspecified: Secondary | ICD-10-CM

## 2021-12-03 DIAGNOSIS — F02811 Dementia in other diseases classified elsewhere, unspecified severity, with agitation: Secondary | ICD-10-CM

## 2021-12-04 NOTE — Progress Notes (Signed)
[facility] Palliative [facility] Consult Note Telephone: [phone]  Fax: [phone]    Date of encounter: 12/03/2021  PATIENT NAME: Parker Chambers [place] [address]   [phone] (home)  DOB: Jul 02, 1950 MRN: [id number] PRIMARY CARE PROVIDER:    Kirk Ruths, MD,  [address] [place] [address] [phone]  REFERRING PROVIDER:   Kirk Ruths, MD [facility] [place],  [place] [address] [phone]  RESPONSIBLE PARTY:    Contact Information     Name Relation Home Work Mobile   Parker Chambers, Parker Chambers Spouse   [phone]   Parker Chambers,Parker Chambers Relative   [phone]        I met face to face with patient and family in the facility. Palliative Care was asked to follow this patient by consultation request of  Kirk Ruths, MD to address advance care planning and complex medical decision making. This is a follow up visit.                                   ASSESSMENT AND PLAN / RECOMMENDATIONS:   Advance Care Planning/Goals of Care: Goals include to maximize quality of life and symptom management. Patient/health care surrogate gave his/her permission to discuss.  CODE STATUS: DNR  Palliative Medicine will continue to provide supportive care, monitor for changes/declines, provide symptom management as needed.   Symptom Management/Plan:  Alzheimer's Dementia-FAST score 7C. Patient is dependent for all adl's including feeding. Staff to reorient and redirect as needed. Monitor for worsening or changes in behaviors. He is monitored for dysphagia; occasional coughing with meals. Receives guaifenesin PRN cough. At risk for recurrent aspiration and infections.  Dysphagia-continue mechanical soft diet with thin liquids; aspiration precautions in place.  Follow up Palliative Care Visit: Palliative care will continue to follow for complex medical decision  making, advance care planning, and clarification of goals. Return in 8 weeks or prn.   This visit was coded based on medical decision making (MDM).  PPS: 30%  HOSPICE ELIGIBILITY/DIAGNOSIS: TBD  Chief Complaint: Palliative Medicine follow up visit.   HISTORY OF PRESENT ILLNESS:  Parker Chambers is a 71 y.o. year old male  with Alzheimer's dementia, hypothyroidism, anxiety, depression, hypertension. Patient hospitalized 6/17-6/22/23 due to aspiration pneumonia, rhinovirus, acute hypoxemic respiratory failure, AMS.    Patient received sitting in common area. He has been doing well per nursing staff. He does answer few yes/no questions today. Non-productive cough observed. He received guaifenesin this morning for cough. No reports of pain, shortness of breath. Good appetite reported. He is dependent for adl's. Staff anticipates care needs. No recent falls, infections.   History obtained from review of EMR, discussion with primary team, and interview with family, facility staff/caregiver and/or Parker Chambers.  I reviewed available labs, medications, imaging, studies and related documents from the EMR.  Records reviewed and summarized above.   ROS  Unable to contribute d/t his dementia.   Physical Exam: Weight: 227 pounds Pulse 72, resp 16, b/p 122/80, sats 92% on room air Constitutional: NAD General: frail appearing EYES: anicteric sclera, lids intact, no discharge  ENMT: intact hearing, oral mucous membranes moist CV: S1S2, RRR, no LE edema Pulmonary: LCTA, no increased work of breathing, non-productive cough, room air Abdomen:  normo-active BS + 4 quadrants, soft and non tender, no ascites GU: deferred MSK: moves all extremities Skin: warm  and dry, no rashes or wounds on visible skin Neuro:  + generalized weakness, A & O to person Psych: non-anxious affect, cooperative Hem/lymph/immuno: no widespread bruising   Thank you for the opportunity to participate in the care of Parker Chambers.   The palliative care team will continue to follow. Please call our office at [phone] if we can be of additional assistance.   Ezekiel Slocumb, NP   COVID-19 PATIENT SCREENING TOOL Asked and negative response unless otherwise noted:   Have you had symptoms of covid, tested positive or been in contact with someone with symptoms/positive test in the past 5-10 days? No

## 2022-03-08 ENCOUNTER — Encounter: Payer: Self-pay | Admitting: Nurse Practitioner

## 2022-03-08 ENCOUNTER — Non-Acute Institutional Stay: Payer: Medicare Other | Admitting: Nurse Practitioner

## 2022-03-08 DIAGNOSIS — R1310 Dysphagia, unspecified: Secondary | ICD-10-CM

## 2022-03-08 DIAGNOSIS — F02811 Dementia in other diseases classified elsewhere, unspecified severity, with agitation: Secondary | ICD-10-CM

## 2022-03-08 DIAGNOSIS — Z515 Encounter for palliative care: Secondary | ICD-10-CM

## 2022-03-08 NOTE — Progress Notes (Signed)
[facility] Palliative [facility] Consult Note Telephone: [phone]  Fax: [phone]    Date of encounter: 03/08/22 7:22 PM PATIENT NAME: Parker Chambers [place] [address]   [phone] (home)  DOB: 07/11/1950 MRN: [id number] PRIMARY CARE PROVIDER:   [facility], Ocie Cornfield, MD,  [address] [place] [address] [phone]  RESPONSIBLE PARTY:    Contact Information     Name Relation Home Work [place] Spouse   [phone]   Dickens,Jill Relative   [phone]           [facility] Palliative Care Consult Note Telephone: [phone]  Fax: [phone]      Date of encounter: 12/03/2021   PATIENT NAME: Parker Chambers [place] [address]   [phone] (home)  DOB: Feb 25, 1951 MRN: [id number] PRIMARY CARE PROVIDER:    Kirk Ruths, MD,  [address] [place] [address] [phone]   REFERRING PROVIDER:   Kirk Ruths, MD [facility] [place],  [place] [address] [phone]   RESPONSIBLE PARTY:    Contact Information       Name Relation Home Work Mobile    Zacharia, Sowles Spouse     [phone]    [place] Relative     [phone]         I met face to face with patient and family in the facility. Palliative Care was asked to follow this patient by consultation request of  Kirk Ruths, MD to address advance care planning and complex medical decision making. This is a follow up visit.                                  ASSESSMENT AND PLAN / RECOMMENDATIONS:  Symptom Management/Plan:  1. Advance Care Planning;  DNR 2. Goals of Care: Goals include to maximize quality of life and symptom management. Our advance care planning conversation included a discussion about:    The value and importance of advance  care planning  Exploration of personal, cultural or spiritual beliefs that might influence medical decisions  Exploration of goals of care in the event of a sudden injury or illness  Identification and preparation of a healthcare agent  Review and updating or creation of an advance directive document. 3. Palliative care encounter; Palliative care encounter; Palliative medicine team will continue to support patient, patient's family, and medical team. Visit consisted of counseling and education dealing with the complex and emotionally intense issues of symptom management and palliative care in the setting of serious and potentially life-threatening illness  4. Alzheimer's Dementia-FAST score 7C. Functionally dependent for all adl's including feeding. At risk for recurrent aspiration and infections.   5. Dysphagia-continue mechanical soft diet with thin liquids; aspiration precautions in place. Continue to monitor diet, supplements, weights.  02/18/2022 weight 219.9 lbs   Follow up Palliative Care Visit: Palliative care will continue to follow for complex medical decision making, advance care planning, and clarification of goals. Return in 8 weeks or prn.   I spent 46 minutes providing this consultation. More than 50% of the time in this consultation was spent in counseling and care coordination.  PPS: 30% Chief Complaint: Follow up palliative consult for complex medical decision making, address goals, manage ongoing symptoms   HISTORY OF PRESENT  ILLNESS:  [name] is a 71 y.o. year old male  with Alzheimer's dementia, hypothyroidism, anxiety, depression, hypertension. Patient hospitalized 09/04/2021-09/09/2021 due to aspiration pneumonia, rhinovirus, acute hypoxemic respiratory failure, AMS.  Mr Hrdlicka resides at [facility], requires total assistance for transfers, mobility, adl's including bathing, dressing, toileting. Mr Moree requires to be fed with good appetite and current weight  219.9 lbs. No recent falls, wounds, infections, hospitalizations per staff. At present Mr Fessel is sitting in upright geri-chair at the dining table being fed. No s/s aspiration. Mr Para March does make eye contact, a word every once in a while during visit though most of visit non-verbal. Mr Para March does appear comfortable. Mr Para March was cooperative with assessment, no meaningful discussion due to cognitive impairment. Medical goals, medications, goc. Attempted to reach Ms Xiang for update on pc visit though no new changes or concerns today. Supportive visit. Will continue to monitor, follow.    History obtained from review of EMR, discussion with primary team, and interview with family, facility staff/caregiver and/or Mr. Chesbro.  I reviewed available labs, medications, imaging, studies and related documents from the EMR.  Records reviewed and summarized above.    ROS Unable to contribute d/t his dementia.    Physical Exam: Constitutional: NAD General: frail appearing, elderly male, debilitated EYES: lids intact ENMT: ral mucous membranes moist CV: +BLE edema Pulmonary: LCTA, no increased work of breathing, non-productive cough, room air Abdomen:  soft  Skin: warm and dry Neuro:  + generalized weakness, A & O to person Psych: non-anxious affect, cooperative     Thank you for the opportunity to participate in the care of Mr. Guo. Please call our office at [phone] if we can be of additional assistance.   Sesar Madewell Ihor Gully, NP

## 2022-05-04 ENCOUNTER — Non-Acute Institutional Stay: Payer: Medicare Other | Admitting: Nurse Practitioner

## 2022-05-04 ENCOUNTER — Encounter: Payer: Self-pay | Admitting: Nurse Practitioner

## 2022-05-04 DIAGNOSIS — G308 Other Alzheimer's disease: Secondary | ICD-10-CM

## 2022-05-04 DIAGNOSIS — R1310 Dysphagia, unspecified: Secondary | ICD-10-CM

## 2022-05-04 DIAGNOSIS — Z515 Encounter for palliative care: Secondary | ICD-10-CM

## 2022-05-04 NOTE — Progress Notes (Signed)
[facility] Consult Note Telephone: [phone]  Fax: [phone]    Date of encounter: 05/04/22 3:04 PM PATIENT NAME: Lui Wageman [place] [address]   [phone] (home)  DOB: 09-Oct-1950 MRN: [id number] PRIMARY CARE PROVIDER:    Kirk Ruths, MD,  [facility]:    Contact Information     Name Relation Home Work [place]   [phone]   Dickens,Jill Relative   [phone]     I met face to face with patient and family in the facility. Palliative Care was asked to follow this patient by consultation request of  Kirk Ruths, MD to address advance care planning and complex medical decision making. This is a follow up visit.                                  ASSESSMENT AND PLAN / RECOMMENDATIONS:  Symptom Management/Plan:  1. Advance Care Planning;  DNR 2. Goals of Care: Goals include to maximize quality of life and symptom management. Our advance care planning conversation included a discussion about:    The value and importance of advance care planning  Exploration of personal, cultural or spiritual beliefs that might influence medical decisions  Exploration of goals of care in the event of a sudden injury or illness  Identification and preparation of a healthcare agent  Review and updating or creation of an advance directive document. 3. Palliative care encounter; Palliative care encounter; Palliative medicine team will continue to support patient, patient's family, and medical team. Visit consisted of counseling and education dealing with the complex and emotionally intense issues of symptom management and palliative care in the setting of serious and potentially life-threatening illness   4. Alzheimer's Dementia-FAST score 7C. Functionally dependent for all adl's including feeding. At risk for recurrent aspiration and infections.   5.  Dysphagia-continue mechanical soft diet with thin liquids; aspiration precautions in place. Continue to monitor diet, supplements, weights.  02/18/2022 weight 219.9 lbs  04/21/2022 weight 214.1 lbs Follow up Palliative Care Visit: Palliative care will continue to follow for complex medical decision making, advance care planning, and clarification of goals. Return in 4 to 8 weeks or prn.   I spent 45 minutes providing this consultation starting at 1:15 pm. More than 50% of the time in this consultation was spent in counseling and care coordination.  PPS: 30% Chief Complaint: Follow up palliative consult for complex medical decision making, address goals, manage ongoing symptoms   HISTORY OF PRESENT ILLNESS:  Kouki Holzapfel is a 72 y.o. year old male  with Alzheimer's dementia, hypothyroidism, anxiety, depression, hypertension. Patient hospitalized 09/04/2021-09/09/2021 due to aspiration pneumonia, rhinovirus, acute hypoxemic respiratory failure, AMS.  Mr Rapa resides at [facility], requires total assistance for transfers, mobility, adl's including bathing, dressing, toileting. Mr Beecham requires to be fed with appetite that varies, no recent episodes of aspiration. No recent falls, wounds. Purpose of today PC f/u visit further discussion monitor trends of appetite, weights, monitor for functional, cognitive decline with chronic disease progression, assess any active symptoms, supportive role. At present Mr Vansomeren is sitting in upright geri-chair in his room being fed lunch, no s/s aspiration.  Mr Para March does make eye contact,  non-verbal. Mr Para March does appear comfortable and cooperative with assessment, no meaningful discussion due to cognitive impairment. Medical goals, medications, goc. Attempted to reach  Ms Kinneman for update on pc visit though no new changes or concerns today. Supportive visit. Will continue to monitor, follow. Mr Cogburn appears stable at present time. I called Ms Para March and Ms  Glory Rosebush (Ms Weiner's sister answered) We talked about pc visit, clinical update, talked about weight loss, no new changes. Recently had covid in 03/2022.    History obtained from review of EMR, discussion with primary team, and interview with family, facility staff/caregiver and/or Mr. Suell.  I reviewed available labs, medications, imaging, studies and related documents from the EMR.  Records reviewed and summarized above.    Physical Exam: General: frail appearing, debilitated, chronically ill male ENMT:  mucous membranes moist CV: +BLE edema Pulmonary: clear decreased bases Abdomen:  soft, BS x 4 Neuro:  + generalized weakness, Alert  Psych: flat affect, cooperative Thank you for the opportunity to participate in the care of Mr. Garvey. Please call our office at [phone] if we can be of additional assistance.   Shylynn Bruning Ihor Gully, NP

## 2022-06-03 ENCOUNTER — Encounter: Payer: Self-pay | Admitting: Nurse Practitioner

## 2022-06-03 ENCOUNTER — Non-Acute Institutional Stay: Payer: Medicare Other | Admitting: Nurse Practitioner

## 2022-06-03 DIAGNOSIS — R1310 Dysphagia, unspecified: Secondary | ICD-10-CM

## 2022-06-03 DIAGNOSIS — R634 Abnormal weight loss: Secondary | ICD-10-CM

## 2022-06-03 DIAGNOSIS — G308 Other Alzheimer's disease: Secondary | ICD-10-CM

## 2022-06-03 DIAGNOSIS — Z515 Encounter for palliative care: Secondary | ICD-10-CM

## 2022-06-03 NOTE — Progress Notes (Signed)
[facility] Consult Note Telephone: [phone]  Fax: [phone]    Date of encounter: 06/03/22 11:09 AM PATIENT NAME: Bassel Rudisill [place] [address]   [phone] (home)  DOB: [birth year] MRN: [id number] PRIMARY CARE PROVIDER:    Kirk Ruths, MD,  [facility]:    Contact Information     Name Relation Home Work [place]   [phone]   Dickens,Jill Relative   [phone]     I met face to face with patient and family in the facility. Palliative Care was asked to follow this patient by consultation request of  Kirk Ruths, MD to address advance care planning and complex medical decision making. This is a follow up visit.                                  ASSESSMENT AND PLAN / RECOMMENDATIONS:  Symptom Management/Plan:  1. Advance Care Planning;  DNR 2. Goals of Care: Goals include to maximize quality of life and symptom management. Our advance care planning conversation included a discussion about:    The value and importance of advance care planning  Exploration of personal, cultural or spiritual beliefs that might influence medical decisions  Exploration of goals of care in the event of a sudden injury or illness  Identification and preparation of a healthcare agent  Review and updating or creation of an advance directive document. 3. Palliative care encounter; Palliative care encounter; Palliative medicine team will continue to support patient, patient's family, and medical team. Visit consisted of counseling and education dealing with the complex and emotionally intense issues of symptom management and palliative care in the setting of serious and potentially life-threatening illness   4. Alzheimer's Dementia-FAST score 7C. Functionally dependent for all adl's including feeding. At risk for recurrent aspiration and infections.   5.  Dysphagia-weight loss; reviewed weights, continue to monitor weights. Continue mechanical soft diet with thin liquids; aspiration precautions in place. Continue to monitor diet, supplements 02/18/2022 weight 219.9 lbs 04/21/2022 weight 214.1 lbs 05/26/2022 weight 211.9 lbs Follow up Palliative Care Visit: Palliative care will continue to follow for complex medical decision making, advance care planning, and clarification of goals. Return in 4 to 8 weeks or prn.   I spent 47 minutes providing this consultation. More than 50% of the time in this consultation was spent in counseling and care coordination.  PPS: 30% Chief Complaint: Follow up palliative consult for complex medical decision making, address goals, manage ongoing symptoms   HISTORY OF PRESENT ILLNESS:  Shed Lipp is a 72 y.o. year old male  with Alzheimer's dementia, hypothyroidism, anxiety, depression, hypertension. Patient hospitalized 09/04/2021-09/09/2021 due to aspiration pneumonia, rhinovirus, acute hypoxemic respiratory failure, AMS.  Mr Tincher resides at [facility], requires total assistance for transfers, mobility, adl's including bathing, dressing, toileting. Mr Langel requires to be fed with appetite that varies, no recent episodes of aspiration. No recent falls, wounds. Purpose of today PC f/u visit further discussion monitor trends of appetite, weights, monitor for functional, cognitive decline with chronic disease progression, assess any active symptoms, supportive role. At present Mr Venteicher is sitting in upright geri-chair in the dining area, does make brief eye contact, makes noises though no meaningful words.  Mr Para March does appear comfortable and cooperative with assessment, no meaningful discussion due to cognitive impairment. Medical goals, medications,  goc. Noted slight weight loss, attempted to contact wife.    History obtained from review of EMR, discussion with primary team, and interview with family, facility  staff/caregiver and/or Mr. Krahl.  I reviewed available labs, medications, imaging, studies and related documents from the EMR.  Records reviewed and summarized above.    Physical Exam: General: frail appearing, debilitated, chronically ill male ENMT:  mucous membranes moist CV: +BLE edema Pulmonary: clear decreased bases Abdomen:  soft, BS x 4 Neuro:  + generalized weakness, Alert  Psych: flat affect, cooperative Thank you for the opportunity to participate in the care of Mr. Cerveny. Please call our office at [phone] if we can be of additional assistance.   Rolene Andrades Ihor Gully, NP

## 2022-07-07 ENCOUNTER — Encounter: Payer: Self-pay | Admitting: Nurse Practitioner

## 2022-07-07 ENCOUNTER — Non-Acute Institutional Stay: Payer: Medicare Other | Admitting: Nurse Practitioner

## 2022-07-07 DIAGNOSIS — R1310 Dysphagia, unspecified: Secondary | ICD-10-CM

## 2022-07-07 DIAGNOSIS — F02811 Dementia in other diseases classified elsewhere, unspecified severity, with agitation: Secondary | ICD-10-CM

## 2022-07-07 DIAGNOSIS — Z515 Encounter for palliative care: Secondary | ICD-10-CM

## 2022-07-07 NOTE — Progress Notes (Signed)
[facility] Palliative Care Consult Note Telephone: [phone]  Fax: [phone]    Date of encounter: 07/07/22 2:40 PM PATIENT NAME: Parker Chambers [address] [place] [address]   [phone] (home)  DOB: 09/26/1950 MRN: [id number] PRIMARY CARE PROVIDER:    Lauro Regulus, MD,  [facility]  RESPONSIBLE PARTY:    Contact Information     Name Relation Home Work Mobile   Parker Chambers, Parker Chambers Spouse   [phone]   Parker Chambers,Parker Chambers Relative   [phone]     I met face to face with patient and family in the facility. Palliative Care was asked to follow this patient by consultation request of  Lauro Regulus, MD to address advance care planning and complex medical decision making. This is a follow up visit.                                  ASSESSMENT AND PLAN / RECOMMENDATIONS:  Symptom Management/Plan: 1. Advance Care Planning;  DNR 2. Palliative care encounter; Palliative care encounter; Palliative medicine team will continue to support patient, patient's family, and medical team. Visit consisted of counseling and education dealing with the complex and emotionally intense issues of symptom management and palliative care in the setting of serious and potentially life-threatening illness   3. Alzheimer's Dementia-FAST score 7C. Functionally dependent for all adl's including feeding. At risk for recurrent aspiration and infections.   4. Dysphagia secondary to Alzheimer dementia-weight stable; reviewed weights, continue to monitor weights. Continue mechanical soft diet with thin liquids; aspiration precautions in place. Continue to monitor diet, supplements reviewed with disease progression, goc, poc, medications, in the setting of chronic disease of Alzheimer's dementia 02/18/2022 weight 219.9 lbs 04/21/2022 weight 214.1 lbs 05/26/2022 weight 211.9 lbs 06/22/2022 weight 211.9 lbs Stable Follow up Palliative Care Visit: Palliative  care will continue to follow for complex medical decision making, advance care planning, and clarification of goals. Return in 4 to 8 weeks or prn.   I spent 45 minutes providing this consultation. More than 50% of the time in this consultation was spent in counseling and care coordination.  PPS: 30% Chief Complaint: Follow up palliative consult for complex medical decision making, address goals, manage ongoing symptoms   HISTORY OF PRESENT ILLNESS:  Parker Chambers is a 72 y.o. year old male  with Alzheimer's dementia, hypothyroidism, anxiety, depression, hypertension. Parker Chambers resides at [facility], requires total assistance for transfers, mobility, adl's including bathing, dressing, toileting. Parker Chambers requires to be fed with appetite that varies, no recent episodes of aspiration. Person staff no recent falls, wounds, hospitalizations, infections. Purpose of today PC f/u visit further discussion monitor trends of appetite, weights, monitor for functional, cognitive decline with chronic disease progression, assess any active symptoms, supportive role. At present Parker Chambers is sitting in upright geri-chair in the dining area, raised his head up, kept eyes closed turning his head to voice. Parker Chambers makes noises though no meaningful words.  Parker Chambers has a chronic cough per staff, he did cough several times during pc visit. Parker Chambers does appear comfortable and cooperative with assessment, no meaningful discussion due to cognitive impairment. Medical goals, medications, goc. Noted slight weight loss, attempted to contact wife.    History obtained from review of EMR, discussion with primary team, and interview with family, facility staff/caregiver and/or Parker Chambers.  I reviewed available labs, medications, imaging, studies and related documents  from the EMR.  Records reviewed and summarized above.    Physical Exam: General: frail appearing, debilitated, chronically ill male ENMT:  mucous  membranes moist CV: +BLE edema Pulmonary: clear decreased bases Abdomen:  soft, BS x 4 Neuro:  + generalized weakness Psych: flat affect, cooperative Thank you for the opportunity to participate in the care of Parker. Chambers. Please call our office at [phone] if we can be of additional assistance.   Parker Hinchman [place], NP

## 2022-08-23 ENCOUNTER — Non-Acute Institutional Stay: Payer: Medicare Other | Admitting: Nurse Practitioner

## 2022-08-23 DIAGNOSIS — G308 Other Alzheimer's disease: Secondary | ICD-10-CM

## 2022-08-23 DIAGNOSIS — Z515 Encounter for palliative care: Secondary | ICD-10-CM

## 2022-08-23 DIAGNOSIS — R634 Abnormal weight loss: Secondary | ICD-10-CM

## 2022-08-23 DIAGNOSIS — R1310 Dysphagia, unspecified: Secondary | ICD-10-CM

## 2022-08-23 NOTE — Progress Notes (Signed)
Therapist, nutritional Palliative Care Consult Note Telephone: [phone]  Fax: [phone]    Date of encounter: 08/23/22 4:15 PM PATIENT NAME: Parker Chambers [address] [place] [address]   [phone] (home)  DOB: [birth year] MRN: [id number] PRIMARY CARE PROVIDER:    Lauro Regulus, MD,  [facility]  RESPONSIBLE PARTY:    Contact Information     Name Relation Home Work Mobile   Parker Chambers, Parker Chambers Spouse   [phone]   Parker Chambers,Parker Chambers Relative   [phone]        I met face to face with patient and family in the facility. Palliative Care was asked to follow this patient by consultation request of  Parker Regulus, MD to address advance care planning and complex medical decision making. This is a follow up visit.                                  ASSESSMENT AND PLAN / RECOMMENDATIONS:  Symptom Management/Plan: 1. Advance Care Planning;  DNR 2. Palliative care encounter; Palliative care encounter; Palliative medicine team will continue to support patient, patient's family, and medical team. Visit consisted of counseling and education dealing with the complex and emotionally intense issues of symptom management and palliative care in the setting of serious and potentially life-threatening illness   3. Alzheimer's Dementia-FAST score 7C. Functionally dependent for all adl's including feeding. At risk for recurrent aspiration and infections.   4. Dysphagia secondary to Alzheimer dementia-weight stable; reviewed weights, continue to monitor weights. Continue mechanical soft diet with thin liquids; aspiration precautions in place. Continue to monitor diet, supplements reviewed with disease progression, goc, poc, medications, in the setting of chronic disease of Alzheimer's dementia  04/21/2022 weight 214.1 lbs 05/26/2022 weight 211.9 lbs 06/22/2022 weight 211.9 lbs 07/20/2022 weight 207.4 lbs Follow up Palliative Care Visit: Palliative  care will continue to follow for complex medical decision making, advance care planning, and clarification of goals. Return in 4 to 8 weeks or prn.   I spent 45 minutes providing this consultation. More than 50% of the time in this consultation was spent in counseling and care coordination.  PPS: 30% Chief Complaint: Follow up palliative consult for complex medical decision making, address goals, manage ongoing symptoms   HISTORY OF PRESENT ILLNESS:  Parker Chambers is a 72 y.o. year old male  with Alzheimer's dementia, hypothyroidism, anxiety, depression, hypertension. Parker Chambers resides at [facility] of [place], requires total assistance for transfers, mobility, adl's including bathing, dressing, toileting. Parker Chambers requires to be fed with appetite that varies, no recent episodes of aspiration. Person staff no recent falls, wounds, hospitalizations, infections. Purpose of today PC f/u visit further discussion monitor trends of appetite, weights, monitor for functional, cognitive decline with chronic disease progression, assess any active symptoms, supportive role. At present Parker Chambers is sitting in upright geri-chair in the dining area, no eye contact.   Parker Chambers does appear comfortable and cooperative with assessment, no meaningful discussion due to cognitive impairment. Medical goals, medications, goc. Noted slight weight loss, attempted to contact wife.    History obtained from review of EMR, discussion with primary team, and interview with family, facility staff/caregiver and/or Parker. Chambers.  I reviewed available labs, medications, imaging, studies and related documents from the EMR.  Records reviewed and summarized above.    Physical Exam: General: frail appearing, debilitated, chronically ill male ENMT:  mucous membranes moist CV: +BLE edema  Pulmonary: clear decreased bases Neuro:  + generalized weakness Psych: flat affect, no eye contact Thank you for the opportunity to participate in the  care of Parker Chambers. Please call our office at [phone] if we can be of additional assistance.   Parker Babich [place], NP

## 2023-06-20 DEATH — deceased

## 2024-06-09 IMAGING — CT CT HEAD W/O CM
4 series · 16 of 47 positions shown, 18 images · non-contrast
Comparison: Head CT dated 08/04/2019.

CLINICAL DATA: Altered mental status.



[Series 2: head wo · axial · 0.48mm/px · z∈[-104,+31]mm · 7 of 37 slices shown, 9 images]
[im 5/37  brain]
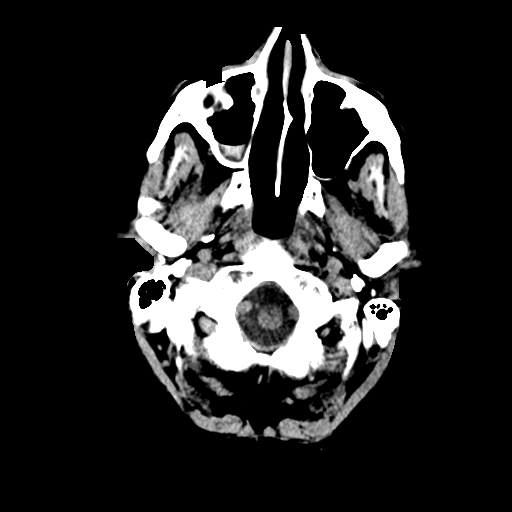
[im 5/37  bone]
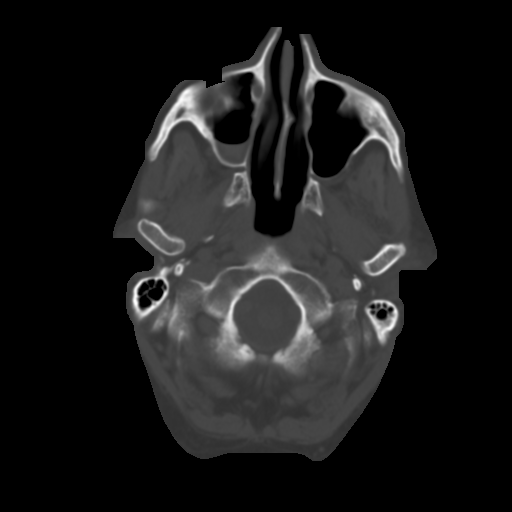
[im 10/37  brain]
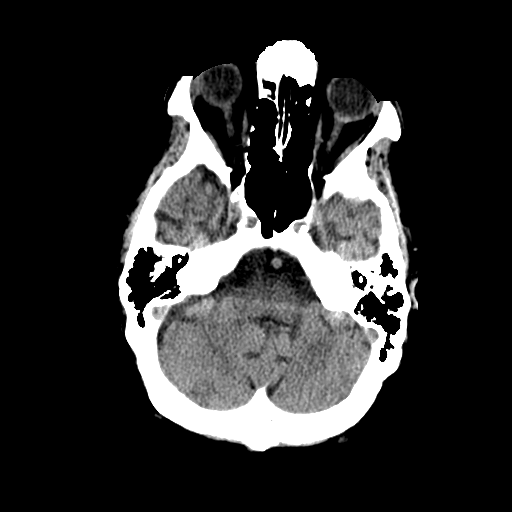
[im 14/37  brain]
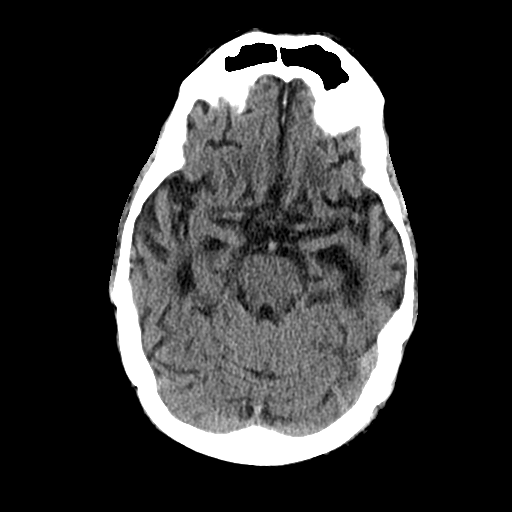
[im 19/37  brain]
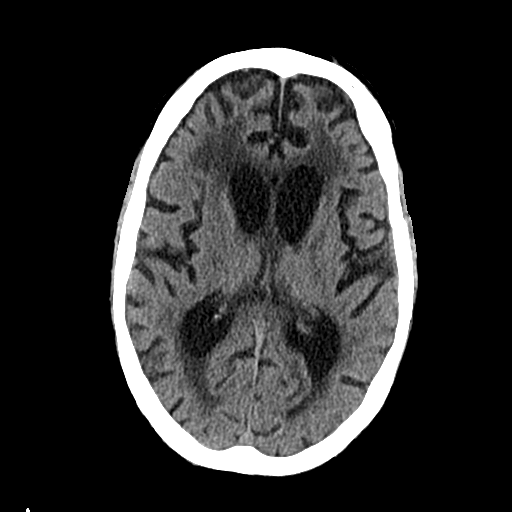
[im 23/37  brain]
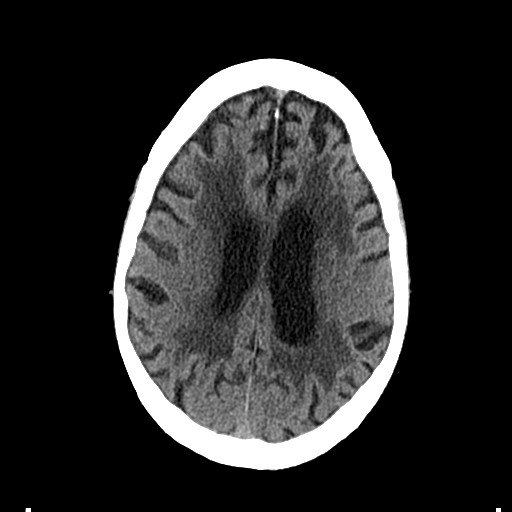
[im 23/37  bone]
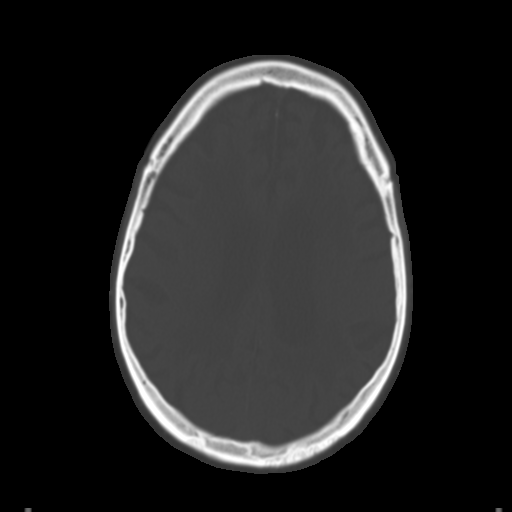
[im 28/37  brain]
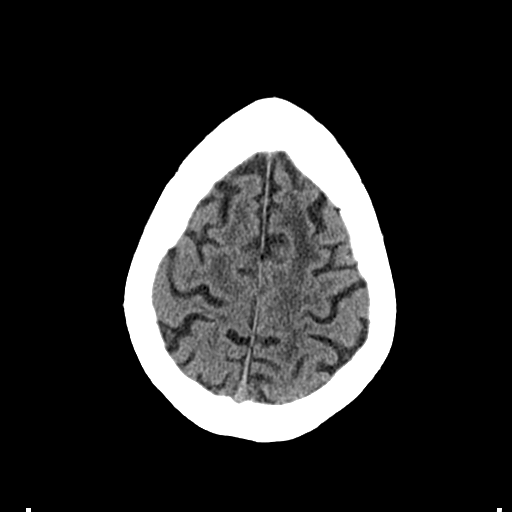
[im 32/37  brain]
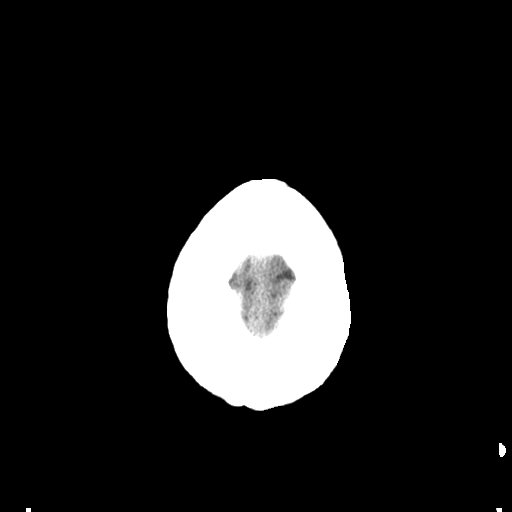

[Series 3: head bone · axial · 0.48mm/px · z∈[-106,-70]mm · 3 of 92 slices shown]
[im 10/92  bone]
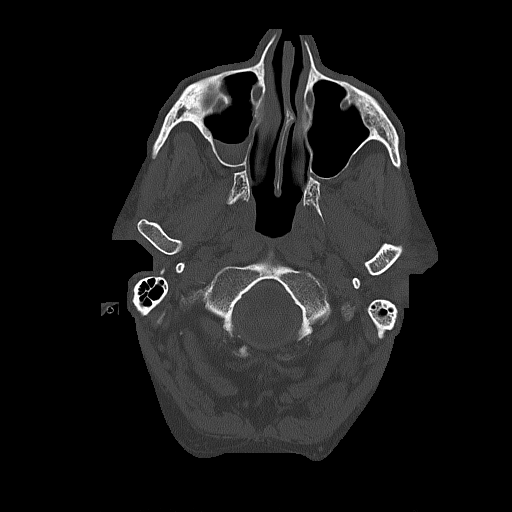
[im 19/92  bone]
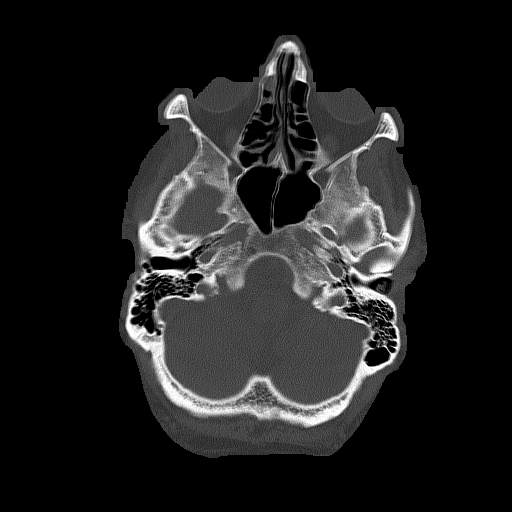
[im 28/92  bone]
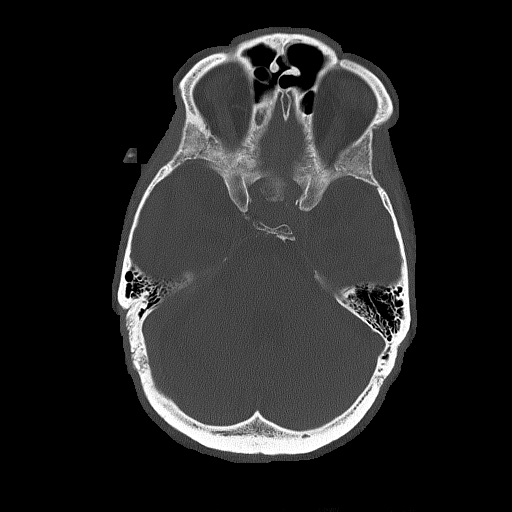

[Series 4: coronal soft tissue · coronal · 0.38mm/px · 3 of 80 slices shown]
[im 27/80  brain]
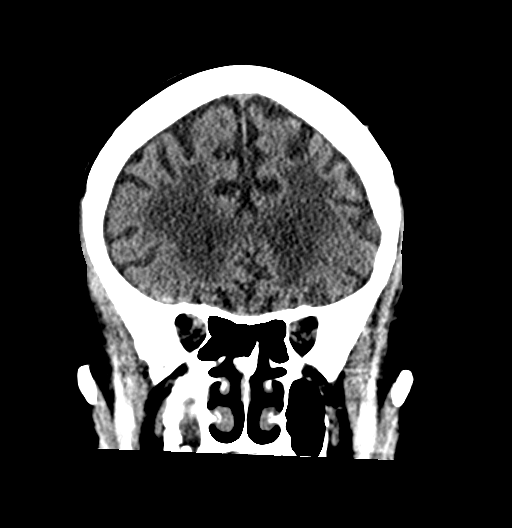
[im 36/80  brain]
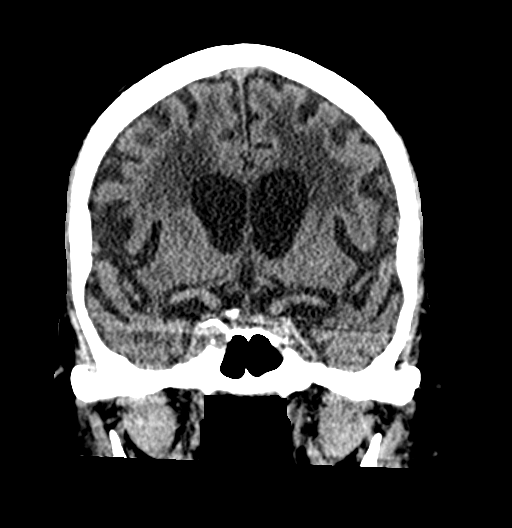
[im 44/80  brain]
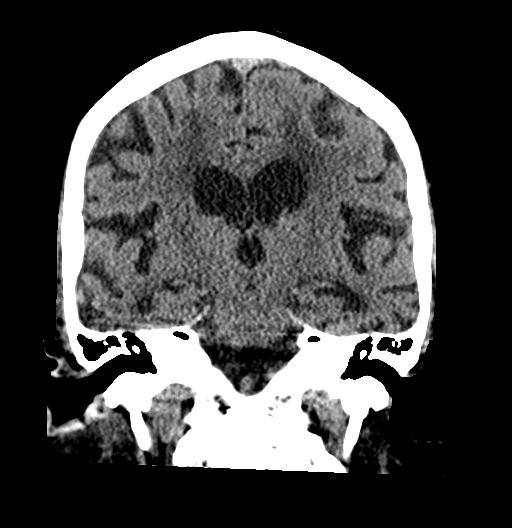

[Series 5: sagittal soft tissue · sagittal · 0.40mm/px · 3 of 62 slices shown]
[im 21/62  brain]
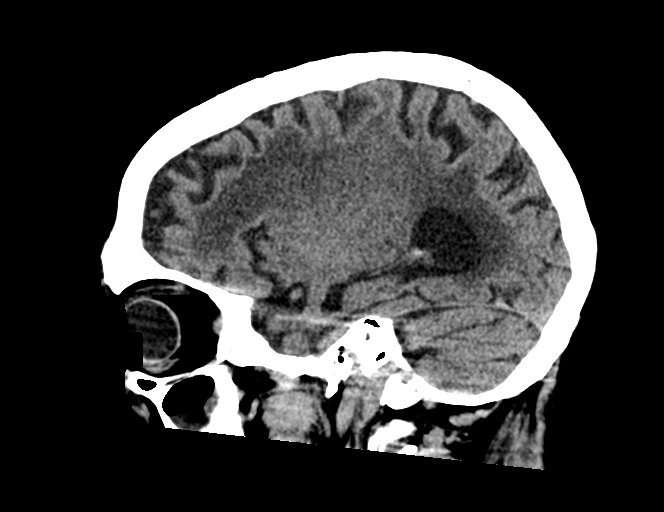
[im 31/62  brain]
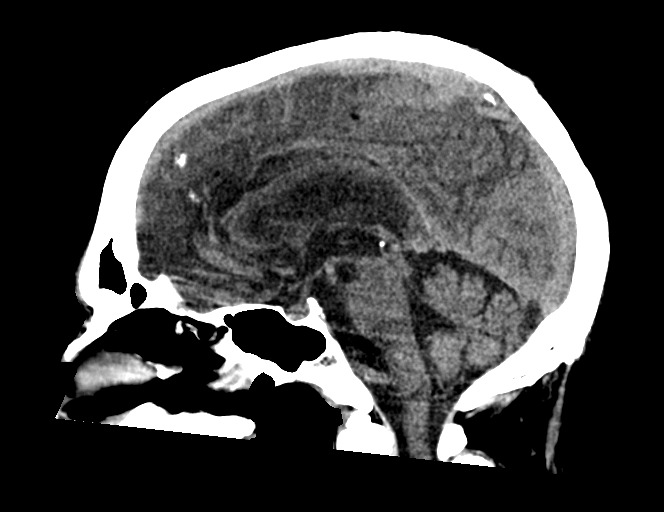
[im 41/62  brain]
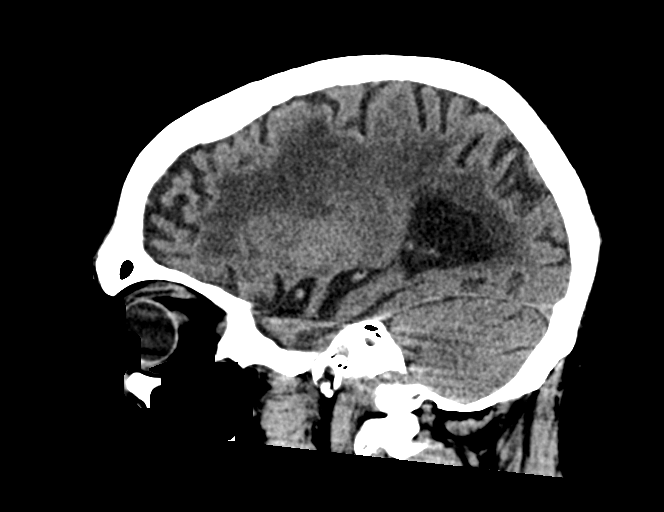

[16 of 47 positions shown; findings below may reference images not displayed]

FINDINGS: Brain: Moderate age-related atrophy and advanced chronic
microvascular ischemic changes. There is no acute intracranial
hemorrhage. No mass effect or midline shift. No extra-axial fluid
collection.

Vascular: No hyperdense vessel or unexpected calcification.

Skull: Normal. Negative for fracture or focal lesion.

Sinuses/Orbits: There is partial opacification of several ethmoid
air cells and right maxillary sinus. The mastoid air cells are
clear.

Other: None
IMPRESSION: 1. No acute intracranial pathology.
2. Moderate age-related atrophy and advanced chronic microvascular
ischemic changes.
3. Paranasal sinus disease.
